# Patient Record
Sex: Male | Born: 1970 | Race: White | Hispanic: No | Marital: Single | Smoking: Never smoker
Health system: Southern US, Community
[De-identification: ages and names within clinical notes are randomized; demographics above are authoritative.]

## PROBLEM LIST (undated history)

## (undated) ENCOUNTER — Ambulatory Visit (HOSPITAL_COMMUNITY): Admission: EM | Payer: Medicaid Other | Source: Home / Self Care

## (undated) DIAGNOSIS — S82141A Displaced bicondylar fracture of right tibia, initial encounter for closed fracture: Secondary | ICD-10-CM

## (undated) DIAGNOSIS — F319 Bipolar disorder, unspecified: Secondary | ICD-10-CM

## (undated) DIAGNOSIS — M545 Low back pain: Principal | ICD-10-CM

## (undated) DIAGNOSIS — M549 Dorsalgia, unspecified: Secondary | ICD-10-CM

## (undated) DIAGNOSIS — R51 Headache: Secondary | ICD-10-CM

## (undated) DIAGNOSIS — G43909 Migraine, unspecified, not intractable, without status migrainosus: Secondary | ICD-10-CM

## (undated) DIAGNOSIS — M479 Spondylosis, unspecified: Secondary | ICD-10-CM

## (undated) DIAGNOSIS — F419 Anxiety disorder, unspecified: Secondary | ICD-10-CM

## (undated) DIAGNOSIS — I10 Essential (primary) hypertension: Secondary | ICD-10-CM

## (undated) DIAGNOSIS — F32A Depression, unspecified: Secondary | ICD-10-CM

## (undated) DIAGNOSIS — G4700 Insomnia, unspecified: Secondary | ICD-10-CM

## (undated) DIAGNOSIS — M79621 Pain in right upper arm: Secondary | ICD-10-CM

## (undated) DIAGNOSIS — J189 Pneumonia, unspecified organism: Secondary | ICD-10-CM

## (undated) DIAGNOSIS — E7800 Pure hypercholesterolemia, unspecified: Secondary | ICD-10-CM

## (undated) DIAGNOSIS — G8929 Other chronic pain: Principal | ICD-10-CM

## (undated) DIAGNOSIS — R519 Headache, unspecified: Secondary | ICD-10-CM

## (undated) DIAGNOSIS — R197 Diarrhea, unspecified: Secondary | ICD-10-CM

## (undated) DIAGNOSIS — R413 Other amnesia: Secondary | ICD-10-CM

## (undated) DIAGNOSIS — F4310 Post-traumatic stress disorder, unspecified: Secondary | ICD-10-CM

## (undated) DIAGNOSIS — F329 Major depressive disorder, single episode, unspecified: Secondary | ICD-10-CM

## (undated) HISTORY — DX: Spondylosis, unspecified: M47.9

## (undated) HISTORY — DX: Pain in right upper arm: M79.621

## (undated) HISTORY — DX: Headache, unspecified: R51.9

## (undated) HISTORY — DX: Other chronic pain: G89.29

## (undated) HISTORY — DX: Headache: R51

## (undated) HISTORY — DX: Migraine, unspecified, not intractable, without status migrainosus: G43.909

## (undated) HISTORY — DX: Low back pain: M54.5

## (undated) HISTORY — DX: Post-traumatic stress disorder, unspecified: F43.10

## (undated) HISTORY — DX: Dorsalgia, unspecified: M54.9

## (undated) HISTORY — PX: MULTIPLE TOOTH EXTRACTIONS: SHX2053

---

## 2013-06-13 ENCOUNTER — Encounter (HOSPITAL_COMMUNITY): Payer: Self-pay | Admitting: Emergency Medicine

## 2013-06-13 ENCOUNTER — Emergency Department (INDEPENDENT_AMBULATORY_CARE_PROVIDER_SITE_OTHER)
Admission: EM | Admit: 2013-06-13 | Discharge: 2013-06-13 | Disposition: A | Discharge status: Home / Self Care | Payer: Medicaid Other | Source: Home / Self Care | Attending: Family Medicine | Admitting: Family Medicine

## 2013-06-13 DIAGNOSIS — J111 Influenza due to unidentified influenza virus with other respiratory manifestations: Secondary | ICD-10-CM

## 2013-06-13 NOTE — ED Notes (Signed)
Reported cough, fever since yesterday

## 2013-06-13 NOTE — ED Provider Notes (Signed)
CSN: [id number]     Arrival date & time 06/13/13  4098 History   First MD Initiated Contact with Patient 06/13/13 1921     No chief complaint on file.  (Consider location/radiation/quality/duration/timing/severity/associated sxs/prior Treatment) Patient is a 42 y.o. male presenting with URI. The history is provided by the patient.  URI Presenting symptoms: congestion, cough, fever and rhinorrhea   Severity:  Mild Onset quality:  Sudden Duration:  2 days Progression:  Unchanged Chronicity:  New Associated symptoms: myalgias   Risk factors: sick contacts     No past medical history on file. No past surgical history on file. No family history on file. History  Substance Use Topics  . Smoking status: Not on file  . Smokeless tobacco: Not on file  . Alcohol Use: Not on file    Review of Systems  Constitutional: Positive for fever and chills.  HENT: Positive for congestion and rhinorrhea.   Respiratory: Positive for cough.   Gastrointestinal: Negative.   Genitourinary: Negative.   Musculoskeletal: Positive for myalgias.    Allergies  Review of patient's allergies indicates not on file.  Home Medications  No current outpatient prescriptions on file. BP 127/82  Pulse 108  Temp(Src) 100.3 F (37.9 C) (Oral)  Resp 16  SpO2 100% Physical Exam  Nursing note and vitals reviewed. Constitutional: He is oriented to person, place, and time. He appears well-developed and well-nourished. No distress.  HENT:  Head: Normocephalic.  Right Ear: External ear normal.  Left Ear: External ear normal.  Mouth/Throat: Oropharynx is clear and moist.  Eyes: Conjunctivae are normal. Pupils are equal, round, and reactive to light.  Neck: Normal range of motion. Neck supple.  Cardiovascular: Regular rhythm, normal heart sounds and intact distal pulses.   Pulmonary/Chest: Effort normal and breath sounds normal. He has no wheezes. He has no rales.  Abdominal: Soft. Bowel sounds are normal.   Lymphadenopathy:    He has no cervical adenopathy.  Neurological: He is alert and oriented to person, place, and time.  Skin: Skin is warm and dry.    ED Course  Procedures (including critical care time) Labs Review Labs Reviewed - No data to display Imaging Review No results found.  EKG Interpretation    Date/Time:    Ventricular Rate:    PR Interval:    QRS Duration:   QT Interval:    QTC Calculation:   R Axis:     Text Interpretation:              MDM   1. Influenza-like illness       Linna Hoff, MD 06/13/13 [phone]

## 2013-12-17 ENCOUNTER — Emergency Department (HOSPITAL_COMMUNITY)
Admission: EM | Admit: 2013-12-17 | Discharge: 2013-12-17 | Disposition: A | Discharge status: Home / Self Care | Payer: Medicaid Other | Attending: Emergency Medicine | Admitting: Emergency Medicine

## 2013-12-17 ENCOUNTER — Encounter (HOSPITAL_COMMUNITY): Payer: Self-pay | Admitting: Emergency Medicine

## 2013-12-17 DIAGNOSIS — Z87891 Personal history of nicotine dependence: Secondary | ICD-10-CM | POA: Insufficient documentation

## 2013-12-17 DIAGNOSIS — L237 Allergic contact dermatitis due to plants, except food: Secondary | ICD-10-CM

## 2013-12-17 DIAGNOSIS — L255 Unspecified contact dermatitis due to plants, except food: Secondary | ICD-10-CM | POA: Insufficient documentation

## 2013-12-17 MED ORDER — PREDNISONE 20 MG PO TABS: 60.0000 mg | ORAL_TABLET | Freq: Every day | ORAL | Status: DC

## 2013-12-17 MED ORDER — HYDROCORTISONE 2.5 % EX LOTN: TOPICAL_LOTION | Freq: Two times a day (BID) | CUTANEOUS | Status: DC

## 2013-12-17 NOTE — ED Notes (Signed)
Pt noticed red itchy rash to entire body yesterday, worse today. [place] warehouse full [place] iv[place]. Denies any other complaints

## 2013-12-17 NOTE — ED Notes (Signed)
Rash all over his body since yesterday.  It started on his rt hand and has spread over his body

## 2013-12-17 NOTE — ED Provider Notes (Signed)
Medical screening examination/treatment/procedure(s) were performed by non-physician practitioner and as supervising physician I was immediately available for consultation/collaboration.   EKG Interpretation None        Dannon Perlow, MD 12/17/13 2010 

## 2013-12-17 NOTE — ED Provider Notes (Signed)
CSN: [id number]     Arrival date & time 12/17/13  1710 History  This chart was scribed for non-physician practitioner working with Glynn OctaveStephen Rancour, MD by Elveria Risingimelie Horne, ED Scribe. This patient was seen in room TR11C/TR11C and the patient's care was started at 6:55 PM.   Chief Complaint  Patient presents with  . Rash     The history is provided by the patient. No language interpreter was used.   HPI Comments: Louis [name] is a 43 y.o. male who presents to the Emergency Department complaining of worsening red, pruritic rash, ongoing for several days. Patient reports performing recent yard work in an area with known poison ivy. Rash generalized over the entire body primarily on the bilateral legs and arms, abdomen, and back. Patient reports that the rash has doubled in size since yesterday. Treatment with calamine lotion and an organic ointment, without relief. Patient denies swelling of lips, tongue or throat. Patient denies introduction of new products or medications.  Denies fever or chills.   History reviewed. No pertinent past medical history. History reviewed. No pertinent past surgical history. History reviewed. No pertinent family history. History  Substance Use Topics  . Smoking status: Former Games developermoker  . Smokeless tobacco: Not on file  . Alcohol Use: No    Review of Systems  Constitutional: Negative for chills.  Respiratory: Negative for shortness of breath.   Skin: Positive for color change and rash.      Allergies  Review of patient's allergies indicates no known allergies.  Home Medications   Prior to Admission medications   Not on File   Triage Vitals: BP 126/92  Pulse 83  Temp(Src) 97.9 F (36.6 C) (Oral)  Resp 16  Ht 5\' 9"  (1.753 m)  Wt 195 lb (88.451 kg)  BMI 28.78 kg/m2  SpO2 96% Physical Exam  Nursing note and vitals reviewed. Constitutional: He is oriented to person, place, and time. He appears well-developed and well-nourished. No distress.  HENT:   Head: Normocephalic and atraumatic.  Mouth/Throat: Oropharynx is clear and moist.  Eyes: EOM are normal.  Neck: Neck supple.  Cardiovascular: Normal rate.   Pulmonary/Chest: Effort normal. No respiratory distress.  Musculoskeletal: Normal range of motion.  Neurological: He is alert and oriented to person, place, and time.  Skin: Skin is warm and dry. Rash noted. There is erythema.  Diffuse erythematous papular rash located around the waistline of abdomen and back and on bilateral legs, arms, forearms, sparing the upper arm that is covered by clothing. No facial rash. No rash present in web spaces of the fingers.  Psychiatric: He has a normal mood and affect. His behavior is normal.    ED Course  Procedures (including critical care time) COORDINATION OF CARE: 7:04 PM- Discussed treatment plan with patient at bedside and patient agreed to plan.   Labs Review Labs Reviewed - No data to display  Imaging Review No results found.   EKG Interpretation None      MDM   Final diagnoses:  None   Patient presenting with a rash after exposure [facility].  Appearance of rash consistent with Poison Ivy.  Patient afebrile.  Non toxic appearing.  No swelling of tongue, lips, or throat.  Feel that the patient is stable for discharge.  Patient given Rx for Hydrocortisone and Prednisone and instructed to take Benadryl for itching.  Patient stable for discharge.  Return precautions given.  I personally performed the services described in this documentation, which was scribed in  my presence. The recorded information has been reviewed and is accurate.    Santiago GladHeather Laisure, PA-C 12/17/13 1913

## 2013-12-17 NOTE — ED Notes (Signed)
Pt waiting to be seen.

## 2013-12-24 ENCOUNTER — Emergency Department (HOSPITAL_COMMUNITY)
Admission: EM | Admit: 2013-12-24 | Discharge: 2013-12-24 | Disposition: A | Discharge status: Home / Self Care | Payer: Medicaid Other | Attending: Emergency Medicine | Admitting: Emergency Medicine

## 2013-12-24 ENCOUNTER — Encounter (HOSPITAL_COMMUNITY): Payer: Self-pay | Admitting: Emergency Medicine

## 2013-12-24 DIAGNOSIS — L255 Unspecified contact dermatitis due to plants, except food: Secondary | ICD-10-CM | POA: Insufficient documentation

## 2013-12-24 DIAGNOSIS — G478 Other sleep disorders: Secondary | ICD-10-CM | POA: Insufficient documentation

## 2013-12-24 DIAGNOSIS — Z87891 Personal history of nicotine dependence: Secondary | ICD-10-CM | POA: Insufficient documentation

## 2013-12-24 DIAGNOSIS — L259 Unspecified contact dermatitis, unspecified cause: Secondary | ICD-10-CM

## 2013-12-24 MED ORDER — HYDROCORTISONE 2.5 % EX LOTN: TOPICAL_LOTION | Freq: Two times a day (BID) | CUTANEOUS | Status: DC

## 2013-12-24 MED ORDER — PREDNISONE 20 MG PO TABS: 60.0000 mg | ORAL_TABLET | Freq: Every day | ORAL | Status: DC

## 2013-12-24 NOTE — ED Notes (Signed)
Pt here for poison ivy check.  sts rash got better than worse again when medication ran out.

## 2013-12-24 NOTE — Discharge Instructions (Signed)
Please read and follow all provided instructions.  Your diagnoses today include:  1. Contact dermatitis     Tests performed today include:  Vital signs. See below for your results today.   Medications prescribed:   Prednisone - steroid medicine   It is best to take this medication in the morning to prevent sleeping problems. If you are diabetic, monitor your blood sugar closely and stop taking Prednisone if blood sugar is over 300. Take with food to prevent stomach upset.   Take any prescribed medications only as directed.  Home care instructions:  Follow any educational materials contained in this packet.  BE VERY CAREFUL not to take multiple medicines containing Tylenol (also called acetaminophen). Doing so can lead to an overdose which can damage your liver and cause liver failure and possibly death.   Follow-up instructions: Please follow-up with your primary care provider in the next 3 days for further evaluation of your symptoms.   Return instructions:   Please return to the Emergency Department if you experience worsening symptoms.   Please return if you have any other emergent concerns.  Additional Information:  Your vital signs today were: BP 140/86   Pulse 95   Temp(Src) 98.7 F (37.1 C) (Oral)   Resp 16   SpO2 98% If your blood pressure (BP) was elevated above 135/85 this visit, please have this repeated by your doctor within one month. --------------

## 2013-12-24 NOTE — ED Provider Notes (Signed)
CSN: [id number]     Arrival date & time 12/24/13  1958 History   This chart was scribed for non-physician practitioner working with [name], MD by Elveria Risingimelie Horne, ED Scribe. This patient was seen in room TR08C/TR08C and the patient's care was started at 8:05 PM.   Chief Complaint  Patient presents with  . Poison Ivy     The history is provided by the patient. No language interpreter was used.   HPI Comments: Louis [name] is a 43 y.o. male who presents to the Emergency Department poison ivy recheck. Patient reports improvement with the Prednisone given at his last visit (6/20), but after finishing the course his rash returned. Patient reports night sweats and difficulty sleeping for the last week. Patient reports taking his last daily dosage of Prednisone during the evenings, around 8 or 9 pm and suspects that could be contributing to his trouble sleeping.  Patient reports weight gain and decreased voids since beginning steroid course. Patient is unsure of tick bites, but states that he does spend a lot of time in the woods. Patient mentions new infestation of bed bugs which he is attempting to eradicate with pesticides. Denies skin exposures to these chemicals.    History reviewed. No pertinent past medical history. History reviewed. No pertinent past surgical history. History reviewed. No pertinent family history. History  Substance Use Topics  . Smoking status: Former Games developermoker  . Smokeless tobacco: Not on file  . Alcohol Use: No    Review of Systems  Constitutional: Negative for fever and chills.  HENT: Negative for facial swelling and trouble swallowing.   Eyes: Negative for redness.  Respiratory: Negative for shortness of breath, wheezing and stridor.   Cardiovascular: Negative for chest pain.  Gastrointestinal: Negative for nausea and vomiting.  Musculoskeletal: Negative for myalgias.  Skin: Positive for rash.  Neurological: Negative for light-headedness.   Psychiatric/Behavioral: Positive for sleep disturbance. Negative for confusion.   Allergies  Review of patient's allergies indicates no known allergies.  Home Medications   Prior to Admission medications   Medication Sig Start Date End Date Taking? Authorizing Provider  hydrocortisone 2.5 % lotion Apply topically 2 (two) times daily. 12/24/13   Renne CriglerJoshua Daphine Loch, PA-C  predniSONE (DELTASONE) 20 MG tablet Take 3 tablets (60 mg total) by mouth daily. Take 3 tablets PO once daily for 3 days, then 2 tablets PO once daily for 3 days, then 1 tablet PO daily for 3 days 12/24/13   Renne CriglerJoshua Miku Udall, PA-C   Triage Vitals: BP 140/86  Pulse 95  Temp(Src) 98.7 F (37.1 C) (Oral)  Resp 16  SpO2 98%  Physical Exam  Nursing note and vitals reviewed. Constitutional: He appears well-developed and well-nourished. No distress.  HENT:  Head: Normocephalic and atraumatic.  Mouth/Throat: Oropharynx is clear and moist.  No oropharyngeal lesions  Eyes: Conjunctivae and EOM are normal. Right eye exhibits no discharge. Left eye exhibits no discharge.  Neck: Normal range of motion. Neck supple. No tracheal deviation present.  Cardiovascular: Normal rate, regular rhythm and normal heart sounds.   Pulmonary/Chest: Effort normal and breath sounds normal. No respiratory distress.  Abdominal: Soft. There is no tenderness.  Musculoskeletal: Normal range of motion. He exhibits no edema and no tenderness.  Neurological: He is alert.  Skin: Skin is warm and dry. Rash noted.  Patient with diffuse erythematous rash over torso, upper and lower extremities consistent with contact dermatitis. There are excoriations on the lower extremities. No palmar rash.   Psychiatric: He  has a normal mood and affect. His behavior is normal.    ED Course  Procedures (including critical care time)  COORDINATION OF CARE: 8:14 PM- Discussed treatment plan with patient at bedside and patient agreed to plan.    Labs Review Labs Reviewed -  No data to display  Imaging Review No results found.   EKG Interpretation None      8:22 PM Patient seen and examined.    Vital signs reviewed and are as follows: Filed Vitals:   12/24/13 2006  BP: 140/86  Pulse: 95  Temp: 98.7 F (37.1 C)  Resp: 16   Rx oral prednisone taper, hydrocortisone.    MDM   Final diagnoses:  Contact dermatitis   Rash: consistent with rebound reaction from contact dermatitis. No confirmed tick bite and this does not appear to be tick borne illness. No new meds or foods. Does not appear to be SJS/TEN.   Sleep disturbance: 2/2 prednisone, patient to take prednisone in morning.   Night sweats: may be related to medication, would not eval further at this point unless symptoms persist.   I personally performed the services described in this documentation, which was scribed in my presence. The recorded information has been reviewed and is accurate.    Renne CriglerJoshua Codie Hainer, PA-C 12/24/13 2028

## 2013-12-27 NOTE — ED Provider Notes (Signed)
Medical screening examination/treatment/procedure(s) were performed by non-physician practitioner and as supervising physician I was immediately available for consultation/collaboration.   EKG Interpretation None        David H Yao, MD 12/27/13 0711 

## 2014-01-15 ENCOUNTER — Encounter (HOSPITAL_COMMUNITY): Payer: Self-pay | Admitting: Emergency Medicine

## 2014-01-15 ENCOUNTER — Emergency Department (HOSPITAL_COMMUNITY)
Admission: EM | Admit: 2014-01-15 | Discharge: 2014-01-15 | Disposition: A | Discharge status: Home / Self Care | Payer: Medicaid Other | Source: Home / Self Care | Attending: Emergency Medicine | Admitting: Emergency Medicine

## 2014-01-15 DIAGNOSIS — B356 Tinea cruris: Secondary | ICD-10-CM

## 2014-01-15 MED ORDER — TRIAMCINOLONE ACETONIDE 0.1 % EX CREA: TOPICAL_CREAM | CUTANEOUS | Status: DC

## 2014-01-15 MED ORDER — KETOCONAZOLE 2 % EX CREA: 1.0000 "application " | TOPICAL_CREAM | Freq: Two times a day (BID) | CUTANEOUS | Status: DC

## 2014-01-15 NOTE — ED Notes (Signed)
Pt. Stated, I have a rash in my groin area maybe its heat.  I had unprotected sex with a male about a couple of weeks ago so Not sure if that is a contributing factor

## 2014-01-15 NOTE — Discharge Instructions (Signed)
Jock Itch Jock itch is a fungal infection of the skin in the groin area. It is sometimes called "ringworm" even though it is not caused by a worm. A fungus is a type of germ that thrives in dark, damp places.  CAUSES  This infection may spread from:  A fungus infection elsewhere on the body (such as athlete's foot).  Sharing towels or clothing. This infection is more common in:  Hot, humid climates.  People who wear tight-fitting clothing or wet bathing suits for long periods of time.  Athletes.  Overweight people.  People with diabetes. SYMPTOMS  Jock itch causes the following symptoms:  Red, pink or brown rash in the groin. Rash may spread to the thighs, anus, and buttocks.  Itching. DIAGNOSIS  Your caregiver may make the diagnosis by looking at the rash. Sometimes a skin scraping will be sent to test for fungus. Testing can be done either by looking under the microscope or by doing a culture (test to try to grow the fungus). A culture can take up to 2 weeks to come back. TREATMENT  Jock itch may be treated with:  Skin cream or ointment to kill fungus.  Medicine by mouth to kill fungus.  Skin cream or ointment to calm the itching.  Compresses or medicated powders to dry the infected skin. HOME CARE INSTRUCTIONS   Be sure to treat the rash completely. Follow your caregiver's instructions. It can take a couple of weeks to treat. If you do not treat the infection long enough, the rash can come back.  Wear loose-fitting clothing.  Men should wear cotton boxer shorts.  Women should wear cotton underwear.  Avoid hot baths.  Dry the groin area well after bathing. SEEK MEDICAL CARE IF:   Your rash is worse.  Your rash is spreading.  Your rash returns after treatment is finished.  Your rash is not gone in 4 weeks. Fungal infections are slow to respond to treatment. Some redness may remain for several weeks after the fungus is gone. SEEK IMMEDIATE MEDICAL CARE  IF:  The area becomes red, warm, tender, and swollen.  You have a fever. Document Released: 06/06/2002 Document Revised: 09/08/2011 Document Reviewed: 05/05/2008 Exit[employer] Patient Information 2015 [employer]. This information is not intended to replace advice given to you by your health care provider. Make sure you discuss any questions you have with your health care provider.  

## 2014-01-15 NOTE — ED Provider Notes (Signed)
  Chief Complaint    Chief Complaint  Patient presents with  . Rash    History of Present Illness      Louis [name] is a 43 year old male who has had a several year long history of rash in both groin areas. This is very itchy. He's tried over-the-counter fungal creams without relief. He denies any rash elsewhere. Sometimes this feels somewhat painful. He's concerned that this might be an STD.  Review of Systems   Other than as noted above, the patient denies any of the following symptoms: Systemic:  No fever or chills. ENT:  No nasal congestion, rhinorrhea, sore throat, swelling of lips, tongue or throat. Resp:  No cough, wheezing, or shortness of breath.  PMFSH    Past medical history, family history, social history, meds, and allergies were reviewed.   Physical Exam     Vital signs:  BP 137/87  Pulse 77  Temp(Src) 99.6 F (37.6 C) (Oral)  Resp 16  SpO2 97% Gen:  Alert, oriented, in no distress. ENT:  Pharynx clear, no intraoral lesions, moist mucous membranes. Lungs:  Clear to auscultation. Skin:  There is an erythematous rash in both groin areas. This has multiple small satellite lesions and there is rash on the scrotum and the penis.  Assessment    The encounter diagnosis was Tinea cruris.  This has the appearance of a fungal or yeast infection, I favor a yeast infection, since he has multiple satellite lesions, and also rash on the scrotum and the penis. This is not an STD.  Plan     1.  Meds:  The following meds were prescribed:   Discharge Medication List as of 01/15/2014 11:37 AM    START taking these medications   Details  ketoconazole (NIZORAL) 2 % cream Apply 1 application topically 2 (two) times daily., Starting 01/15/2014, Until Discontinued, Normal    triamcinolone cream (KENALOG) 0.1 % Apply to poison ivy rash TID, Normal        2.  Patient Education/Counseling:  The patient was given appropriate handouts, self care instructions, and instructed in  symptomatic relief.    3.  Follow up:  The patient was told to follow up here if no better in 3 to 4 days, or sooner if becoming worse in any way, and given some red flag symptoms such as worsening rash, fever, or difficulty breathing which would prompt immediate return.  Follow up here if necessary.      Reuben Likesavid C Kenston Longton, MD 01/15/14 2111

## 2014-03-08 ENCOUNTER — Encounter: Payer: Self-pay | Admitting: Neurology

## 2014-03-09 ENCOUNTER — Ambulatory Visit (INDEPENDENT_AMBULATORY_CARE_PROVIDER_SITE_OTHER): Payer: Medicaid Other | Admitting: Neurology

## 2014-03-09 ENCOUNTER — Encounter: Payer: Self-pay | Admitting: Neurology

## 2014-03-09 ENCOUNTER — Encounter (INDEPENDENT_AMBULATORY_CARE_PROVIDER_SITE_OTHER): Payer: Self-pay

## 2014-03-09 VITALS — BP 124/77 | HR 79 | Resp 17 | Ht 69.5 in | Wt 195.0 lb

## 2014-03-09 DIAGNOSIS — M545 Low back pain, unspecified: Secondary | ICD-10-CM

## 2014-03-09 DIAGNOSIS — M47812 Spondylosis without myelopathy or radiculopathy, cervical region: Secondary | ICD-10-CM

## 2014-03-09 DIAGNOSIS — M79621 Pain in right upper arm: Secondary | ICD-10-CM

## 2014-03-09 DIAGNOSIS — G8929 Other chronic pain: Secondary | ICD-10-CM | POA: Insufficient documentation

## 2014-03-09 DIAGNOSIS — M79609 Pain in unspecified limb: Secondary | ICD-10-CM

## 2014-03-09 HISTORY — DX: Low back pain, unspecified: M54.50

## 2014-03-09 HISTORY — DX: Pain in right upper arm: M79.621

## 2014-03-09 MED ORDER — ALPRAZOLAM 1 MG PO TABS: ORAL_TABLET | ORAL | Status: DC

## 2014-03-09 NOTE — Addendum Note (Signed)
Addended by: Melvyn Novas on: 03/09/2014 01:47 PM   Modules accepted: Orders

## 2014-03-09 NOTE — Addendum Note (Signed)
Addended by: Melvyn Novas on: 03/09/2014 01:46 PM   Modules accepted: Orders

## 2014-03-09 NOTE — Patient Instructions (Signed)
Electromyography (EMG) Test This is a test in which very small electrodes are placed into your muscle tissue. It looks at the electrical impulses of your muscle tissue. This test is used to determine whether or not there are involuntary or spontaneous muscle movements. Involuntary or spontaneous means muscle movements that happen by themselves. This may indicate injury or disease of the nerves which supply that muscle. PREPARATION FOR TEST No preparation or fasting is necessary. Some stimulants such as caffeine and tobacco may need to be avoided for 2-3 hours before test or as instructed by your caregiver. NORMAL FINDINGS No evidence of neuromuscular abnormalities. Ranges for normal findings may vary among different laboratories and hospitals. You should always check with your doctor after having lab work or other tests done to discuss the meaning of your test results and whether your values are considered within normal limits. MEANING OF TEST  Your caregiver will go over the test results with you and discuss the importance and meaning of your results, as well as treatment options and the need for additional tests if necessary. OBTAINING THE TEST RESULTS It is your responsibility to obtain your test results. Ask the lab or department performing the test when and how you will get your results. Document Released: 10/17/2004 Document Revised: 09/08/2011 Document Reviewed: 05/26/2008 Exit[employer] Patient Information 2015 [employer]. This information is not intended to replace advice given to you by your health care provider. Make sure you discuss any questions you have with your health care provider.  
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
## 2014-03-13 ENCOUNTER — Ambulatory Visit: Payer: Medicaid Other | Admitting: Neurology

## 2014-03-15 ENCOUNTER — Encounter: Payer: Medicaid Other | Admitting: Radiology

## 2014-03-17 ENCOUNTER — Ambulatory Visit (INDEPENDENT_AMBULATORY_CARE_PROVIDER_SITE_OTHER): Payer: Medicaid Other | Admitting: Neurology

## 2014-03-17 ENCOUNTER — Encounter (INDEPENDENT_AMBULATORY_CARE_PROVIDER_SITE_OTHER): Payer: Medicaid Other

## 2014-03-17 DIAGNOSIS — M545 Low back pain, unspecified: Secondary | ICD-10-CM

## 2014-03-17 DIAGNOSIS — M79631 Pain in right forearm: Secondary | ICD-10-CM

## 2014-03-17 DIAGNOSIS — Z0289 Encounter for other administrative examinations: Secondary | ICD-10-CM

## 2014-03-17 DIAGNOSIS — G8929 Other chronic pain: Secondary | ICD-10-CM

## 2014-03-17 DIAGNOSIS — M79609 Pain in unspecified limb: Secondary | ICD-10-CM

## 2014-03-17 NOTE — Procedures (Signed)
     HISTORY:  Louis [name] is a 43 year old patient with a several month history of right forearm discomfort. The patient does have some neck discomfort off and on, but no pain radiating down the right arm. The patient is being evaluated for the ongoing arm pain.  NERVE CONDUCTION STUDIES:  Nerve conduction studies were performed on both upper extremities. The distal motor latencies and motor amplitudes for the median and ulnar nerves were within normal limits. The F wave latencies and nerve conduction velocities for these nerves were also normal. The sensory latencies for the median and ulnar nerves were normal.   EMG STUDIES:  EMG study was performed on the right upper extremity:  The first dorsal interosseous muscle reveals 2 to 4 K units with full recruitment. No fibrillations or positive waves were noted. The abductor pollicis brevis muscle reveals 2 to 4 K units with full recruitment. No fibrillations or positive waves were noted. The extensor indicis proprius muscle reveals 1 to 3 K units with full recruitment. No fibrillations or positive waves were noted. The pronator teres muscle reveals 2 to 3 K units with full recruitment. No fibrillations or positive waves were noted. The biceps muscle reveals 1 to 2 K units with full recruitment. No fibrillations or positive waves were noted. The triceps muscle reveals 2 to 4 K units with full recruitment. No fibrillations or positive waves were noted. The anterior deltoid muscle reveals 2 to 3 K units with full recruitment. No fibrillations or positive waves were noted. The cervical paraspinal muscles were tested at 2 levels. No abnormalities of insertional activity were seen at either level tested. There was poor relaxation.   IMPRESSION:  Nerve conduction studies done on both upper extremities were within normal limits. No evidence of a neuropathy is seen. EMG evaluation of the right upper extremity was normal, without evidence of an  overlying cervical radiculopathy.  Marlan Palau MD 03/17/2014 1:50 PM  [facility] [address] [place], [place] [address]  Phone [phone] Fax [phone]

## 2014-03-20 ENCOUNTER — Telehealth: Payer: Self-pay | Admitting: Neurology

## 2014-03-20 DIAGNOSIS — M79606 Pain in leg, unspecified: Secondary | ICD-10-CM

## 2014-03-20 DIAGNOSIS — G4733 Obstructive sleep apnea (adult) (pediatric): Secondary | ICD-10-CM

## 2014-03-20 NOTE — Telephone Encounter (Signed)
Patient returning a missed call to [place] but does not have access to his VM, and there was no documentation. Please call and advise.

## 2014-03-20 NOTE — Telephone Encounter (Signed)
Patient was given his results of his emg.  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  Patient also states he has lower back x-rays and wanted to know if Dr. Vickey Huger needed to review those reports.

## 2014-03-21 NOTE — Telephone Encounter (Signed)
i will defer any limits in lifting , bending and ambulatory capacity to PT -  This is not in my scope of practice.

## 2014-03-22 NOTE — Telephone Encounter (Signed)
Patient was notified and advised to discuss with his primary care physician.

## 2014-04-30 HISTORY — PX: ORIF TIBIA FRACTURE: SHX5416

## 2014-05-04 ENCOUNTER — Encounter (HOSPITAL_COMMUNITY): Payer: Self-pay | Admitting: Emergency Medicine

## 2014-05-04 ENCOUNTER — Emergency Department (HOSPITAL_COMMUNITY): Payer: No Typology Code available for payment source

## 2014-05-04 ENCOUNTER — Emergency Department (HOSPITAL_COMMUNITY)
Admission: EM | Admit: 2014-05-04 | Discharge: 2014-05-04 | Disposition: A | Discharge status: Home / Self Care | Payer: No Typology Code available for payment source | Attending: Emergency Medicine | Admitting: Emergency Medicine

## 2014-05-04 DIAGNOSIS — Y9389 Activity, other specified: Secondary | ICD-10-CM | POA: Diagnosis not present

## 2014-05-04 DIAGNOSIS — G8929 Other chronic pain: Secondary | ICD-10-CM | POA: Diagnosis not present

## 2014-05-04 DIAGNOSIS — F4310 Post-traumatic stress disorder, unspecified: Secondary | ICD-10-CM | POA: Insufficient documentation

## 2014-05-04 DIAGNOSIS — Z7952 Long term (current) use of systemic steroids: Secondary | ICD-10-CM | POA: Insufficient documentation

## 2014-05-04 DIAGNOSIS — Z79899 Other long term (current) drug therapy: Secondary | ICD-10-CM | POA: Insufficient documentation

## 2014-05-04 DIAGNOSIS — S82201A Unspecified fracture of shaft of right tibia, initial encounter for closed fracture: Secondary | ICD-10-CM

## 2014-05-04 DIAGNOSIS — S81811A Laceration without foreign body, right lower leg, initial encounter: Secondary | ICD-10-CM | POA: Diagnosis not present

## 2014-05-04 DIAGNOSIS — S99911A Unspecified injury of right ankle, initial encounter: Secondary | ICD-10-CM | POA: Diagnosis not present

## 2014-05-04 DIAGNOSIS — M479 Spondylosis, unspecified: Secondary | ICD-10-CM | POA: Insufficient documentation

## 2014-05-04 DIAGNOSIS — M79601 Pain in right arm: Secondary | ICD-10-CM | POA: Diagnosis not present

## 2014-05-04 DIAGNOSIS — G43909 Migraine, unspecified, not intractable, without status migrainosus: Secondary | ICD-10-CM | POA: Diagnosis not present

## 2014-05-04 DIAGNOSIS — S8991XA Unspecified injury of right lower leg, initial encounter: Secondary | ICD-10-CM | POA: Diagnosis present

## 2014-05-04 DIAGNOSIS — S82301A Unspecified fracture of lower end of right tibia, initial encounter for closed fracture: Secondary | ICD-10-CM | POA: Diagnosis not present

## 2014-05-04 DIAGNOSIS — Y92410 Unspecified street and highway as the place of occurrence of the external cause: Secondary | ICD-10-CM | POA: Diagnosis not present

## 2014-05-04 LAB — BASIC METABOLIC PANEL
ANION GAP: 17 — AB (ref 5–15)
BUN: 12 mg/dL (ref 6–23)
CHLORIDE: 106 meq/L (ref 96–112)
CO2: 21 meq/L (ref 19–32)
Calcium: 9.2 mg/dL (ref 8.4–10.5)
Creatinine, Ser: 1.05 mg/dL (ref 0.50–1.35)
GFR calc Af Amer: >90 mL/min (ref >90)
GFR calc non Af Amer: 85 mL/min — ABNORMAL LOW (ref >90)
Glucose, Bld: 90 mg/dL (ref 70–99)
POTASSIUM: 3.6 meq/L — AB (ref 3.7–5.3)
SODIUM: 144 meq/L (ref 137–147)

## 2014-05-04 LAB — CBC WITH DIFFERENTIAL/PLATELET
BASOS ABS: 0 10*3/uL (ref 0.0–0.1)
Basophils Relative: 0 % (ref 0–1)
Eosinophils Absolute: 0.1 10*3/uL (ref 0.0–0.7)
Eosinophils Relative: 1 % (ref 0–5)
HCT: 39.7 % (ref 39.0–52.0)
HEMOGLOBIN: 12.9 g/dL — AB (ref 13.0–17.0)
LYMPHS PCT: 9 % — AB (ref 12–46)
Lymphs Abs: 1.3 10*3/uL (ref 0.7–4.0)
MCH: 27.3 pg (ref 26.0–34.0)
MCHC: 32.5 g/dL (ref 30.0–36.0)
MCV: 84.1 fL (ref 78.0–100.0)
Monocytes Absolute: 1.5 10*3/uL — ABNORMAL HIGH (ref 0.1–1.0)
Monocytes Relative: 10 % (ref 3–12)
NEUTROS PCT: 80 % — AB (ref 43–77)
Neutro Abs: 12 10*3/uL — ABNORMAL HIGH (ref 1.7–7.7)
PLATELETS: 216 10*3/uL (ref 150–400)
RBC: 4.72 MIL/uL (ref 4.22–5.81)
RDW: 13.5 % (ref 11.5–15.5)
WBC: 14.9 10*3/uL — ABNORMAL HIGH (ref 4.0–10.5)

## 2014-05-04 MED ORDER — SODIUM CHLORIDE 0.9 % IV BOLUS (SEPSIS)
1000.0000 mL | Freq: Once | INTRAVENOUS | Status: AC
  Administered 2014-05-04: 1000 mL via INTRAVENOUS

## 2014-05-04 MED ORDER — HYDROMORPHONE HCL 1 MG/ML IJ SOLN
1.0000 mg | Freq: Once | INTRAMUSCULAR | Status: AC
  Administered 2014-05-04: 1 mg via INTRAVENOUS
  Filled 2014-05-04: qty 1

## 2014-05-04 MED ORDER — DOCUSATE SODIUM 100 MG PO CAPS: 100.0000 mg | ORAL_CAPSULE | Freq: Two times a day (BID) | ORAL | Status: DC

## 2014-05-04 MED ORDER — LORAZEPAM 2 MG/ML IJ SOLN
1.0000 mg | Freq: Once | INTRAMUSCULAR | Status: AC
  Administered 2014-05-04: 1 mg via INTRAVENOUS
  Filled 2014-05-04: qty 1

## 2014-05-04 MED ORDER — ONDANSETRON HCL 4 MG/2ML IJ SOLN
4.0000 mg | Freq: Once | INTRAMUSCULAR | Status: AC
  Administered 2014-05-04: 4 mg via INTRAVENOUS
  Filled 2014-05-04: qty 2

## 2014-05-04 MED ORDER — MORPHINE SULFATE 4 MG/ML IJ SOLN
4.0000 mg | Freq: Once | INTRAMUSCULAR | Status: AC
  Administered 2014-05-04: 4 mg via INTRAVENOUS
  Filled 2014-05-04: qty 1

## 2014-05-04 MED ORDER — OXYCODONE-ACETAMINOPHEN 5-325 MG PO TABS: 1.0000 | ORAL_TABLET | ORAL | Status: DC | PRN

## 2014-05-04 MED ORDER — TETANUS-DIPHTH-ACELL PERTUSSIS 5-2.5-18.5 LF-MCG/0.5 IM SUSP: 0.5000 mL | Freq: Once | INTRAMUSCULAR | Status: DC

## 2014-05-04 NOTE — ED Notes (Signed)
Pt still in XRAY.  

## 2014-05-04 NOTE — ED Notes (Signed)
This RN received a phone call from radiology, having a difficult time carrying out XRAYs, pt is in a lot of pain.

## 2014-05-04 NOTE — ED Notes (Signed)
Pt gone to xray at this time

## 2014-05-04 NOTE — Progress Notes (Signed)
Orthopedic Tech Progress Note Patient Details:  Beatriz StallionKirby L Kham [birth year] [id number]  Ortho Devices Type of Ortho Device: Crutches, Lenora BoysWatson Jones splint, Knee Immobilizer Ortho Device/Splint Location: RLE Ortho Device/Splint Interventions: Ordered, Application   Jennye MoccasinHughes, Tramayne Sebesta Craig 05/04/2014, 9:36 PM

## 2014-05-04 NOTE — ED Notes (Signed)
Spoke with ortho regarding pt's new orders.
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
## 2014-05-04 NOTE — ED Notes (Signed)
Ortho at [facility]

## 2014-05-04 NOTE — ED Notes (Signed)
MD informed pt is in a lot of pain.

## 2014-05-05 ENCOUNTER — Emergency Department (HOSPITAL_COMMUNITY): Payer: No Typology Code available for payment source

## 2014-05-05 ENCOUNTER — Encounter (HOSPITAL_COMMUNITY): Payer: Self-pay | Admitting: *Deleted

## 2014-05-05 ENCOUNTER — Emergency Department (HOSPITAL_COMMUNITY)
Admission: EM | Admit: 2014-05-05 | Discharge: 2014-05-05 | Disposition: A | Discharge status: Home / Self Care | Payer: No Typology Code available for payment source | Attending: Emergency Medicine | Admitting: Emergency Medicine

## 2014-05-05 DIAGNOSIS — G43909 Migraine, unspecified, not intractable, without status migrainosus: Secondary | ICD-10-CM | POA: Diagnosis not present

## 2014-05-05 DIAGNOSIS — Y9389 Activity, other specified: Secondary | ICD-10-CM | POA: Insufficient documentation

## 2014-05-05 DIAGNOSIS — F329 Major depressive disorder, single episode, unspecified: Secondary | ICD-10-CM | POA: Insufficient documentation

## 2014-05-05 DIAGNOSIS — R519 Headache, unspecified: Secondary | ICD-10-CM

## 2014-05-05 DIAGNOSIS — S82301A Unspecified fracture of lower end of right tibia, initial encounter for closed fracture: Secondary | ICD-10-CM | POA: Insufficient documentation

## 2014-05-05 DIAGNOSIS — Y92410 Unspecified street and highway as the place of occurrence of the external cause: Secondary | ICD-10-CM | POA: Insufficient documentation

## 2014-05-05 DIAGNOSIS — G8929 Other chronic pain: Secondary | ICD-10-CM | POA: Insufficient documentation

## 2014-05-05 DIAGNOSIS — S8991XA Unspecified injury of right lower leg, initial encounter: Secondary | ICD-10-CM | POA: Diagnosis present

## 2014-05-05 DIAGNOSIS — S82201A Unspecified fracture of shaft of right tibia, initial encounter for closed fracture: Secondary | ICD-10-CM

## 2014-05-05 DIAGNOSIS — R52 Pain, unspecified: Secondary | ICD-10-CM

## 2014-05-05 DIAGNOSIS — R51 Headache: Secondary | ICD-10-CM

## 2014-05-05 DIAGNOSIS — Z79899 Other long term (current) drug therapy: Secondary | ICD-10-CM | POA: Diagnosis not present

## 2014-05-05 DIAGNOSIS — S0990XA Unspecified injury of head, initial encounter: Secondary | ICD-10-CM | POA: Insufficient documentation

## 2014-05-05 DIAGNOSIS — Z7952 Long term (current) use of systemic steroids: Secondary | ICD-10-CM | POA: Insufficient documentation

## 2014-05-05 HISTORY — DX: Depression, unspecified: F32.A

## 2014-05-05 HISTORY — DX: Major depressive disorder, single episode, unspecified: F32.9

## 2014-05-05 MED ORDER — ONDANSETRON 4 MG PO TBDP
8.0000 mg | ORAL_TABLET | Freq: Once | ORAL | Status: AC
  Administered 2014-05-05: 8 mg via ORAL
  Filled 2014-05-05: qty 2

## 2014-05-05 MED ORDER — DIAZEPAM 5 MG PO TABS: 5.0000 mg | ORAL_TABLET | Freq: Three times a day (TID) | ORAL | Status: DC | PRN

## 2014-05-05 MED ORDER — HYDROMORPHONE HCL 1 MG/ML IJ SOLN
1.0000 mg | Freq: Once | INTRAMUSCULAR | Status: AC
  Administered 2014-05-05: 1 mg via INTRAVENOUS
  Filled 2014-05-05: qty 1

## 2014-05-05 MED ORDER — OXYCODONE-ACETAMINOPHEN 5-325 MG PO TABS: 1.0000 | ORAL_TABLET | ORAL | Status: DC | PRN

## 2014-05-05 MED ORDER — HYDROMORPHONE HCL 1 MG/ML IJ SOLN
2.0000 mg | Freq: Once | INTRAMUSCULAR | Status: AC
  Administered 2014-05-05: 2 mg via INTRAVENOUS
  Filled 2014-05-05: qty 2

## 2014-05-05 MED ORDER — PROMETHAZINE HCL 25 MG PO TABS: 25.0000 mg | ORAL_TABLET | Freq: Four times a day (QID) | ORAL | Status: DC | PRN

## 2014-05-05 NOTE — ED Notes (Signed)
Pt. Vomited again. Told patient plan of care and that we will watch him for a while but states "I'll be okay. I have kittens to get home to".

## 2014-05-05 NOTE — ED Notes (Addendum)
Pt. Advised not to drive or drink due to pain medicine. Patient stated "I need a note for court on Monday so that I don't drive while I take my pain medicine". PA aware and note printed and faxed. Pt. Asking "can I just have one pina colada?". Pt. Again educated on narcotic pain medicine.

## 2014-05-05 NOTE — ED Notes (Signed)
Pt. Began vomiting when he was in the wheelchair being rolled out to the waiting room. Brought back to room. PA informed
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
## 2014-05-05 NOTE — ED Notes (Signed)
Pt. Reports SI. Reports hx of depression and suicide attempts. When asked if he feels like harming himself currently, patient states "this is normal for me. I see someone every Monday for it. I have a great therapist".

## 2014-05-07 ENCOUNTER — Encounter (HOSPITAL_COMMUNITY): Payer: Self-pay | Admitting: Emergency Medicine

## 2014-05-07 ENCOUNTER — Emergency Department (HOSPITAL_COMMUNITY)
Admission: EM | Admit: 2014-05-07 | Discharge: 2014-05-08 | Disposition: A | Discharge status: Home / Self Care | Payer: No Typology Code available for payment source | Attending: Emergency Medicine | Admitting: Emergency Medicine

## 2014-05-07 DIAGNOSIS — Z79899 Other long term (current) drug therapy: Secondary | ICD-10-CM | POA: Diagnosis not present

## 2014-05-07 DIAGNOSIS — Z7952 Long term (current) use of systemic steroids: Secondary | ICD-10-CM | POA: Insufficient documentation

## 2014-05-07 DIAGNOSIS — M79604 Pain in right leg: Secondary | ICD-10-CM | POA: Diagnosis present

## 2014-05-07 DIAGNOSIS — R202 Paresthesia of skin: Secondary | ICD-10-CM | POA: Diagnosis not present

## 2014-05-07 DIAGNOSIS — G8929 Other chronic pain: Secondary | ICD-10-CM | POA: Insufficient documentation

## 2014-05-07 DIAGNOSIS — G43909 Migraine, unspecified, not intractable, without status migrainosus: Secondary | ICD-10-CM | POA: Diagnosis not present

## 2014-05-07 DIAGNOSIS — F329 Major depressive disorder, single episode, unspecified: Secondary | ICD-10-CM | POA: Diagnosis not present

## 2014-05-07 DIAGNOSIS — R209 Unspecified disturbances of skin sensation: Secondary | ICD-10-CM

## 2014-05-07 MED ORDER — HYDROMORPHONE HCL 1 MG/ML IJ SOLN
1.0000 mg | Freq: Once | INTRAMUSCULAR | Status: AC
  Administered 2014-05-07: 1 mg via INTRAMUSCULAR
  Filled 2014-05-07: qty 1

## 2014-05-07 NOTE — Discharge Instructions (Signed)
Keep right leg elevated. Do not put weight on the right leg
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
## 2014-05-08 ENCOUNTER — Other Ambulatory Visit: Payer: Self-pay | Admitting: Orthopedic Surgery

## 2014-05-08 ENCOUNTER — Encounter (HOSPITAL_COMMUNITY): Payer: Self-pay | Admitting: Radiology

## 2014-05-08 ENCOUNTER — Emergency Department (HOSPITAL_COMMUNITY): Payer: No Typology Code available for payment source

## 2014-05-08 LAB — I-STAT CHEM 8, ED
BUN: 14 mg/dL (ref 6–23)
CHLORIDE: 103 meq/L (ref 96–112)
CREATININE: 0.9 mg/dL (ref 0.50–1.35)
Calcium, Ion: 1.18 mmol/L (ref 1.12–1.23)
GLUCOSE: 99 mg/dL (ref 70–99)
HCT: 35 % — ABNORMAL LOW (ref 39.0–52.0)
Hemoglobin: 11.9 g/dL — ABNORMAL LOW (ref 13.0–17.0)
POTASSIUM: 4.3 meq/L (ref 3.7–5.3)
Sodium: 140 mEq/L (ref 137–147)
TCO2: 26 mmol/L (ref 0–100)

## 2014-05-08 MED ORDER — DIPHENHYDRAMINE HCL 50 MG/ML IJ SOLN
25.0000 mg | Freq: Once | INTRAMUSCULAR | Status: AC
  Administered 2014-05-08: 25 mg via INTRAVENOUS
  Filled 2014-05-08: qty 1

## 2014-05-08 MED ORDER — METHYLPREDNISOLONE SODIUM SUCC 125 MG IJ SOLR
125.0000 mg | Freq: Once | INTRAMUSCULAR | Status: AC
  Administered 2014-05-08: 125 mg via INTRAVENOUS
  Filled 2014-05-08: qty 2

## 2014-05-08 MED ORDER — IOHEXOL 350 MG/ML SOLN
100.0000 mL | Freq: Once | INTRAVENOUS | Status: AC | PRN
  Administered 2014-05-08: 100 mL via INTRAVENOUS
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
## 2014-05-08 NOTE — ED Notes (Signed)
Patient is alert and orientedx4.  Patient was explained discharge instructions and they understood them with no questions.   

## 2014-05-08 NOTE — ED Notes (Signed)
Patient transported to CT 
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
## 2014-05-08 NOTE — ED Provider Notes (Signed)
Patient signed out to me as pending CTA for popliteal injury. Patient's right lower extremity is more cold than the left. He does have a pulse present. Case was discussed by Dr. Manus Gunningancour with orthopedic surgery and vascular surgery and they believe this may be due to the swelling was having decreased flow of the artery. Compartments remain soft. Patient is in no acute distress laying comfortably in the bed. CT scan has a preliminary read as no vascular injury. Official reading will be available in the morning. Upon my repeat assessment, the patient's symptoms have not been any worse. I believe he is safe for discharge at this time and he can follow-up with his surgeons. Discharge paperwork was prepared by Dr. Manus Gunningancour and resident. Vital signs remain within his normal limits.  Tomasita CrumbleAdeleke Jiles Goya, MD 05/08/14 [phone]

## 2014-05-09 ENCOUNTER — Encounter (HOSPITAL_COMMUNITY)
Admission: RE | Admit: 2014-05-09 | Discharge: 2014-05-09 | Disposition: A | Discharge status: Home / Self Care | Payer: No Typology Code available for payment source | Source: Ambulatory Visit | Attending: Orthopedic Surgery | Admitting: Orthopedic Surgery

## 2014-05-09 ENCOUNTER — Encounter (HOSPITAL_COMMUNITY): Payer: Self-pay

## 2014-05-09 ENCOUNTER — Ambulatory Visit (HOSPITAL_COMMUNITY)
Admission: RE | Admit: 2014-05-09 | Discharge: 2014-05-09 | Disposition: A | Discharge status: Home / Self Care | Payer: No Typology Code available for payment source | Source: Ambulatory Visit | Attending: Orthopedic Surgery | Admitting: Orthopedic Surgery

## 2014-05-09 DIAGNOSIS — Z01818 Encounter for other preprocedural examination: Secondary | ICD-10-CM

## 2014-05-09 HISTORY — DX: Other amnesia: R41.3

## 2014-05-09 HISTORY — DX: Anxiety disorder, unspecified: F41.9

## 2014-05-09 HISTORY — DX: Pneumonia, unspecified organism: J18.9

## 2014-05-09 HISTORY — DX: Diarrhea, unspecified: R19.7

## 2014-05-09 HISTORY — DX: Bipolar disorder, unspecified: F31.9

## 2014-05-09 HISTORY — DX: Essential (primary) hypertension: I10

## 2014-05-09 HISTORY — DX: Displaced bicondylar fracture of right tibia, initial encounter for closed fracture: S82.141A

## 2014-05-09 HISTORY — DX: Insomnia, unspecified: G47.00

## 2014-05-09 HISTORY — DX: Pure hypercholesterolemia, unspecified: E78.00

## 2014-05-09 MED ORDER — CEFAZOLIN SODIUM-DEXTROSE 2-3 GM-% IV SOLR
2.0000 g | INTRAVENOUS | Status: AC
  Administered 2014-05-10: 2 g via INTRAVENOUS
  Filled 2014-05-09: qty 50

## 2014-05-09 NOTE — Progress Notes (Signed)
Pt forgot name of new PCP; pt stated that he is seen at [facility] for care.

## 2014-05-09 NOTE — Pre-Procedure Instructions (Signed)
Louis StallionKirby L Flener  05/09/2014   Your procedure is scheduled on:  Wednesday, May 10, 2014  [facility]:30 [facility].  Call this number if you have problems the morning of surgery: [phone]   Remember:   Do not eat food or drink liquids after midnight.   Take these medicines the morning of surgery with A SIP OF WATER: gabapentin (NEURONTIN),lamoTRIgine (LAMICTAL),Lurasidone HCl (LATUDA), Oxcarbazepine (TRILEPTAL), if needed: oxyCODONE ( PERCOCET/ROXICET) for pain,  SUMAtriptan (IMITREX) for migraine  Stop taking Aspirin, vitamins, and herbal medications (Melatonin).  Do not take any NSAIDs ie: Ibuprofen, Advil, Naproxen or any medication containing Aspirin.    Do not wear jewelry, make-up or nail polish.  Do not wear lotions, powders, or perfumes. You may not  wear deodorant.  Do not shave 48 hours prior to surgery. Men may shave face and neck.  Do not bring valuables to the hospital.  [facility] is not responsible for any belongings or valuables.               Contacts, dentures or bridgework may not be worn into surgery.  Leave suitcase in the car. After surgery it may be brought to your room.  For patients admitted to the hospital, discharge time is determined by your treatment team.               Patients discharged the day of surgery will not be allowed to drive home.  Name and phone number of your driver:   Special Instructions:  Special Instructions:Special Instructions: [facility] - Preparing for [facility]  Before surgery, you can play an important role.  Because skin is not sterile, your skin needs to be as free of germs as possible.  You can reduce the number of germs on you skin by washing with CHG (chlorahexidine gluconate) soap before surgery.  CHG is an antiseptic cleaner which kills germs and bonds with the skin to continue killing germs even after washing.  Please DO NOT use if you have an allergy to CHG or antibacterial soaps.  If your  skin becomes reddened/irritated stop using the CHG and inform your nurse when you arrive at Short Stay.  Do not shave (including legs and underarms) for at least 48 hours prior to the first CHG shower.  You may shave your face.  Please follow these instructions carefully:   1.  Shower with CHG Soap the night before surgery and the morning of Surgery.  2.  If you choose to wash your hair, wash your hair first as usual with your normal shampoo.  3.  After you shampoo, rinse your hair and body thoroughly to remove the Shampoo.  4.  Use CHG as you would any other liquid soap.  You can apply chg directly  to the skin and wash gently with scrungie or a clean washcloth.  5.  Apply the CHG Soap to your body ONLY FROM THE NECK DOWN.  Do not use on open wounds or open sores.  Avoid contact with your eyes, ears, mouth and genitals (private parts).  Wash genitals (private parts) with your normal soap.  6.  Wash thoroughly, paying special attention to the area where your surgery will be performed.  7.  Thoroughly rinse your body with warm water from the neck down.  8.  DO NOT shower/wash with your normal soap after using and rinsing off the CHG Soap.  9.  Pat yourself dry with a clean towel.  10.  Wear clean pajamas.            11.  Place clean sheets on your bed the night of your first shower and do not sleep with pets.  Day of Surgery  Do not apply any lotions/deodorants the morning of surgery.  Please wear clean clothes to the hospital/surgery center.   Please read over the following fact sheets that you were given: Pain Booklet, Coughing and Deep Breathing and Surgical Site Infection Prevention

## 2014-05-09 NOTE — Progress Notes (Signed)
Pt denies SOB, chest pain, and being under the care of a cardiologist. Pt denies having a chest x ray and EKG within the last year. Pt denies having a stress test, echo, and cardiac cath. 

## 2014-05-10 ENCOUNTER — Ambulatory Visit (HOSPITAL_COMMUNITY): Payer: No Typology Code available for payment source

## 2014-05-10 ENCOUNTER — Encounter (HOSPITAL_COMMUNITY): Payer: Self-pay | Admitting: Anesthesiology

## 2014-05-10 ENCOUNTER — Encounter (HOSPITAL_COMMUNITY)
Admission: RE | Disposition: A | Discharge status: Skilled Nursing Facility | Payer: Self-pay | Source: Ambulatory Visit | Attending: Orthopedic Surgery

## 2014-05-10 ENCOUNTER — Ambulatory Visit (HOSPITAL_COMMUNITY): Payer: No Typology Code available for payment source | Admitting: Certified Registered Nurse Anesthetist

## 2014-05-10 ENCOUNTER — Inpatient Hospital Stay (HOSPITAL_COMMUNITY)
Admission: RE | Admit: 2014-05-10 | Discharge: 2014-05-13 | DRG: 494 | Disposition: A | Discharge status: Skilled Nursing Facility | Payer: No Typology Code available for payment source | Source: Ambulatory Visit | Attending: Orthopedic Surgery | Admitting: Orthopedic Surgery

## 2014-05-10 DIAGNOSIS — F419 Anxiety disorder, unspecified: Secondary | ICD-10-CM | POA: Diagnosis present

## 2014-05-10 DIAGNOSIS — G43909 Migraine, unspecified, not intractable, without status migrainosus: Secondary | ICD-10-CM | POA: Diagnosis present

## 2014-05-10 DIAGNOSIS — M25561 Pain in right knee: Secondary | ICD-10-CM | POA: Diagnosis present

## 2014-05-10 DIAGNOSIS — I10 Essential (primary) hypertension: Secondary | ICD-10-CM | POA: Diagnosis present

## 2014-05-10 DIAGNOSIS — G4700 Insomnia, unspecified: Secondary | ICD-10-CM | POA: Diagnosis present

## 2014-05-10 DIAGNOSIS — S82141A Displaced bicondylar fracture of right tibia, initial encounter for closed fracture: Secondary | ICD-10-CM | POA: Diagnosis present

## 2014-05-10 DIAGNOSIS — F4310 Post-traumatic stress disorder, unspecified: Secondary | ICD-10-CM | POA: Diagnosis present

## 2014-05-10 DIAGNOSIS — S82141G Displaced bicondylar fracture of right tibia, subsequent encounter for closed fracture with delayed healing: Principal | ICD-10-CM

## 2014-05-10 DIAGNOSIS — F319 Bipolar disorder, unspecified: Secondary | ICD-10-CM | POA: Diagnosis present

## 2014-05-10 DIAGNOSIS — S82209A Unspecified fracture of shaft of unspecified tibia, initial encounter for closed fracture: Secondary | ICD-10-CM

## 2014-05-10 DIAGNOSIS — M545 Low back pain: Secondary | ICD-10-CM | POA: Diagnosis present

## 2014-05-10 HISTORY — PX: ORIF TIBIA PLATEAU: SHX2132

## 2014-05-10 SURGERY — OPEN REDUCTION INTERNAL FIXATION (ORIF) TIBIAL PLATEAU
Anesthesia: General | Laterality: Right

## 2014-05-10 MED ORDER — KCL IN DEXTROSE-NACL 20-5-0.45 MEQ/L-%-% IV SOLN
INTRAVENOUS | Status: DC
  Administered 2014-05-11 – 2014-05-13 (×5): via INTRAVENOUS
  Filled 2014-05-10 (×8): qty 1000

## 2014-05-10 MED ORDER — ASPIRIN EC 325 MG PO TBEC: 325.0000 mg | DELAYED_RELEASE_TABLET | Freq: Two times a day (BID) | ORAL | Status: DC

## 2014-05-10 MED ORDER — PROPOFOL 10 MG/ML IV BOLUS
INTRAVENOUS | Status: AC
  Filled 2014-05-10: qty 20

## 2014-05-10 MED ORDER — MELATONIN 3 MG PO TABS: 1.0000 | ORAL_TABLET | Freq: Every day | ORAL | Status: DC

## 2014-05-10 MED ORDER — METOCLOPRAMIDE HCL 5 MG/ML IJ SOLN
5.0000 mg | Freq: Three times a day (TID) | INTRAMUSCULAR | Status: DC | PRN
Start: 2014-05-10 — End: 2014-05-13

## 2014-05-10 MED ORDER — EPHEDRINE SULFATE 50 MG/ML IJ SOLN
INTRAMUSCULAR | Status: AC
  Filled 2014-05-10: qty 1

## 2014-05-10 MED ORDER — HYDROMORPHONE HCL 1 MG/ML IJ SOLN
INTRAMUSCULAR | Status: AC
  Filled 2014-05-10: qty 2

## 2014-05-10 MED ORDER — DOCUSATE SODIUM 100 MG PO CAPS
100.0000 mg | ORAL_CAPSULE | Freq: Two times a day (BID) | ORAL | Status: DC
  Administered 2014-05-10 – 2014-05-13 (×5): 100 mg via ORAL
  Filled 2014-05-10 (×7): qty 1

## 2014-05-10 MED ORDER — FENTANYL CITRATE 0.05 MG/ML IJ SOLN
INTRAMUSCULAR | Status: DC | PRN
  Administered 2014-05-10: 50 ug via INTRAVENOUS
  Administered 2014-05-10: 25 ug via INTRAVENOUS
  Administered 2014-05-10: 100 ug via INTRAVENOUS
  Administered 2014-05-10: 50 ug via INTRAVENOUS
  Administered 2014-05-10: 25 ug via INTRAVENOUS

## 2014-05-10 MED ORDER — HYDROCODONE-ACETAMINOPHEN 5-325 MG PO TABS
1.0000 | ORAL_TABLET | ORAL | Status: DC | PRN
  Administered 2014-05-10: 2 via ORAL
  Administered 2014-05-12: 1 via ORAL
  Filled 2014-05-10: qty 1
  Filled 2014-05-10: qty 2

## 2014-05-10 MED ORDER — DEXAMETHASONE SODIUM PHOSPHATE 4 MG/ML IJ SOLN
INTRAMUSCULAR | Status: AC
  Filled 2014-05-10: qty 2

## 2014-05-10 MED ORDER — OXYCODONE-ACETAMINOPHEN 5-325 MG PO TABS
1.0000 | ORAL_TABLET | ORAL | Status: DC | PRN
  Administered 2014-05-10 – 2014-05-13 (×11): 2 via ORAL
  Filled 2014-05-10 (×11): qty 2

## 2014-05-10 MED ORDER — 0.9 % SODIUM CHLORIDE (POUR BTL) OPTIME
TOPICAL | Status: DC | PRN
  Administered 2014-05-10: 1000 mL

## 2014-05-10 MED ORDER — OXYCODONE HCL 5 MG/5ML PO SOLN: 5.0000 mg | Freq: Once | ORAL | Status: DC | PRN

## 2014-05-10 MED ORDER — DEXAMETHASONE SODIUM PHOSPHATE 10 MG/ML IJ SOLN
INTRAMUSCULAR | Status: DC | PRN
  Administered 2014-05-10: 10 mg via INTRAVENOUS

## 2014-05-10 MED ORDER — OXYCODONE-ACETAMINOPHEN 5-325 MG PO TABS: 1.0000 | ORAL_TABLET | Freq: Four times a day (QID) | ORAL | Status: DC | PRN

## 2014-05-10 MED ORDER — MIDAZOLAM HCL 2 MG/2ML IJ SOLN
INTRAMUSCULAR | Status: AC
  Filled 2014-05-10: qty 2

## 2014-05-10 MED ORDER — HYDROMORPHONE HCL 1 MG/ML IJ SOLN
0.2500 mg | INTRAMUSCULAR | Status: DC | PRN
  Administered 2014-05-10 (×4): 0.5 mg via INTRAVENOUS

## 2014-05-10 MED ORDER — OXYCODONE HCL 5 MG PO TABS: 5.0000 mg | ORAL_TABLET | Freq: Once | ORAL | Status: DC | PRN

## 2014-05-10 MED ORDER — ONDANSETRON HCL 4 MG/2ML IJ SOLN
INTRAMUSCULAR | Status: DC | PRN
  Administered 2014-05-10: 4 mg via INTRAVENOUS

## 2014-05-10 MED ORDER — LIDOCAINE HCL (CARDIAC) 20 MG/ML IV SOLN
INTRAVENOUS | Status: DC | PRN
  Administered 2014-05-10: 40 mg via INTRAVENOUS

## 2014-05-10 MED ORDER — DEXTROSE-NACL 5-0.45 % IV SOLN: INTRAVENOUS | Status: DC

## 2014-05-10 MED ORDER — ONDANSETRON HCL 4 MG PO TABS: 4.0000 mg | ORAL_TABLET | Freq: Four times a day (QID) | ORAL | Status: DC | PRN

## 2014-05-10 MED ORDER — LACTATED RINGERS IV SOLN
INTRAVENOUS | Status: DC
  Administered 2014-05-10: 11:00:00 via INTRAVENOUS

## 2014-05-10 MED ORDER — MIDAZOLAM HCL 5 MG/5ML IJ SOLN
INTRAMUSCULAR | Status: DC | PRN
  Administered 2014-05-10: 2 mg via INTRAVENOUS

## 2014-05-10 MED ORDER — DIAZEPAM 5 MG PO TABS
5.0000 mg | ORAL_TABLET | Freq: Three times a day (TID) | ORAL | Status: DC | PRN
  Administered 2014-05-11 – 2014-05-13 (×5): 5 mg via ORAL
  Filled 2014-05-10 (×5): qty 1

## 2014-05-10 MED ORDER — METHOCARBAMOL 500 MG PO TABS
500.0000 mg | ORAL_TABLET | Freq: Four times a day (QID) | ORAL | Status: DC | PRN
  Administered 2014-05-11 – 2014-05-13 (×3): 500 mg via ORAL
  Filled 2014-05-10 (×3): qty 1

## 2014-05-10 MED ORDER — LURASIDONE HCL 20 MG PO TABS
1.0000 | ORAL_TABLET | Freq: Every day | ORAL | Status: DC
  Administered 2014-05-11 – 2014-05-13 (×3): 20 mg via ORAL
  Filled 2014-05-10 (×4): qty 1

## 2014-05-10 MED ORDER — LAMOTRIGINE 100 MG PO TABS
100.0000 mg | ORAL_TABLET | Freq: Two times a day (BID) | ORAL | Status: DC
  Administered 2014-05-10 – 2014-05-13 (×6): 100 mg via ORAL
  Filled 2014-05-10 (×7): qty 1

## 2014-05-10 MED ORDER — LACTATED RINGERS IV SOLN
INTRAVENOUS | Status: DC | PRN
  Administered 2014-05-10 (×2): via INTRAVENOUS

## 2014-05-10 MED ORDER — GUANFACINE HCL 2 MG PO TABS
2.0000 mg | ORAL_TABLET | Freq: Every day | ORAL | Status: DC
  Administered 2014-05-10 – 2014-05-12 (×3): 2 mg via ORAL
  Filled 2014-05-10 (×5): qty 1

## 2014-05-10 MED ORDER — HYDROMORPHONE HCL 1 MG/ML IJ SOLN
0.5000 mg | INTRAMUSCULAR | Status: DC | PRN
  Administered 2014-05-10 – 2014-05-13 (×8): 1 mg via INTRAVENOUS
  Filled 2014-05-10 (×8): qty 1

## 2014-05-10 MED ORDER — PROPOFOL 10 MG/ML IV BOLUS
INTRAVENOUS | Status: DC | PRN
  Administered 2014-05-10: 200 mg via INTRAVENOUS

## 2014-05-10 MED ORDER — METHOCARBAMOL 1000 MG/10ML IJ SOLN
500.0000 mg | Freq: Four times a day (QID) | INTRAVENOUS | Status: DC | PRN
  Filled 2014-05-10: qty 5

## 2014-05-10 MED ORDER — ASPIRIN 325 MG PO TABS
325.0000 mg | ORAL_TABLET | Freq: Two times a day (BID) | ORAL | Status: DC
  Administered 2014-05-11 – 2014-05-13 (×5): 325 mg via ORAL
  Filled 2014-05-10 (×8): qty 1

## 2014-05-10 MED ORDER — FENTANYL CITRATE 0.05 MG/ML IJ SOLN
INTRAMUSCULAR | Status: AC
  Filled 2014-05-10: qty 5

## 2014-05-10 MED ORDER — ONDANSETRON HCL 4 MG/2ML IJ SOLN: 4.0000 mg | Freq: Four times a day (QID) | INTRAMUSCULAR | Status: DC | PRN

## 2014-05-10 MED ORDER — STERILE WATER FOR INJECTION IJ SOLN
INTRAMUSCULAR | Status: AC
  Filled 2014-05-10: qty 10

## 2014-05-10 MED ORDER — OXCARBAZEPINE 300 MG PO TABS
300.0000 mg | ORAL_TABLET | Freq: Two times a day (BID) | ORAL | Status: DC
  Administered 2014-05-10 – 2014-05-13 (×6): 300 mg via ORAL
  Filled 2014-05-10 (×7): qty 1

## 2014-05-10 MED ORDER — DEXAMETHASONE SODIUM PHOSPHATE 10 MG/ML IJ SOLN
INTRAMUSCULAR | Status: AC
  Filled 2014-05-10: qty 1

## 2014-05-10 MED ORDER — GLYCOPYRROLATE 0.2 MG/ML IJ SOLN
INTRAMUSCULAR | Status: DC | PRN
  Administered 2014-05-10: 0.2 mg via INTRAVENOUS

## 2014-05-10 MED ORDER — METOCLOPRAMIDE HCL 10 MG PO TABS: 5.0000 mg | ORAL_TABLET | Freq: Three times a day (TID) | ORAL | Status: DC | PRN

## 2014-05-10 MED ORDER — GABAPENTIN 600 MG PO TABS
600.0000 mg | ORAL_TABLET | Freq: Two times a day (BID) | ORAL | Status: DC
  Administered 2014-05-10 – 2014-05-13 (×6): 600 mg via ORAL
  Filled 2014-05-10 (×7): qty 1

## 2014-05-10 SURGICAL SUPPLY — 73 items
APPLICATOR CHLORAPREP 3ML ORNG (MISCELLANEOUS) ×3 IMPLANT
BANDAGE ELASTIC 6 VELCRO ST LF (GAUZE/BANDAGES/DRESSINGS) ×3 IMPLANT
BIT DRILL 2.5 X LONG (BIT) ×1
BIT DRILL 2.5X110 QC LCP DISP (BIT) ×3 IMPLANT
BIT DRILL CALI LONG 2.8MM (BIT) ×1 IMPLANT
BIT DRILL Q COUPLING 4.5 (BIT) ×3 IMPLANT
BIT DRILL QC 3.5X110 (BIT) ×3 IMPLANT
BIT DRILL X LONG 2.5 (BIT) ×1 IMPLANT
BLADE SURG 15 STRL LF DISP TIS (BLADE) ×1 IMPLANT
BLADE SURG 15 STRL SS (BLADE) ×2
BLADE SURG ROTATE 9660 (MISCELLANEOUS) IMPLANT
BNDG COHESIVE 4X5 TAN STRL (GAUZE/BANDAGES/DRESSINGS) ×3 IMPLANT
BNDG GAUZE ELAST 4 BULKY (GAUZE/BANDAGES/DRESSINGS) ×3 IMPLANT
CHLORAPREP W/TINT 26ML (MISCELLANEOUS) ×3 IMPLANT
COVER SURGICAL LIGHT HANDLE (MISCELLANEOUS) ×3 IMPLANT
CUFF TOURNIQUET SINGLE 34IN LL (TOURNIQUET CUFF) ×3 IMPLANT
DRAPE C-ARM 42X72 X-RAY (DRAPES) ×3 IMPLANT
DRAPE ORTHO SPLIT 77X108 STRL (DRAPES) ×4
DRAPE PROXIMA HALF (DRAPES) ×6 IMPLANT
DRAPE SURG ORHT 6 SPLT 77X108 (DRAPES) ×2 IMPLANT
DRAPE U-SHAPE 47X51 STRL (DRAPES) ×3 IMPLANT
DRILL BIT CALI LONG 2.8MM (BIT) ×3
DRILL BIT X LONG 2.5 (BIT) ×2
DRSG ADAPTIC 3X8 NADH LF (GAUZE/BANDAGES/DRESSINGS) ×3 IMPLANT
DURAPREP 26ML APPLICATOR (WOUND CARE) IMPLANT
ELECT REM PT RETURN 9FT ADLT (ELECTROSURGICAL) ×3
ELECTRODE REM PT RTRN 9FT ADLT (ELECTROSURGICAL) ×1 IMPLANT
GAUZE SPONGE 4X4 12PLY STRL (GAUZE/BANDAGES/DRESSINGS) ×6 IMPLANT
GLOVE BIO SURGEON STRL SZ7.5 (GLOVE) ×3 IMPLANT
GLOVE BIO SURGEON STRL SZ8.5 (GLOVE) ×3 IMPLANT
GLOVE BIOGEL PI IND STRL 6.5 (GLOVE) ×1 IMPLANT
GLOVE BIOGEL PI IND STRL 8 (GLOVE) ×1 IMPLANT
GLOVE BIOGEL PI IND STRL 9 (GLOVE) ×1 IMPLANT
GLOVE BIOGEL PI INDICATOR 6.5 (GLOVE) ×2
GLOVE BIOGEL PI INDICATOR 8 (GLOVE) ×2
GLOVE BIOGEL PI INDICATOR 9 (GLOVE) ×2
GLOVE INDICATOR 6.5 STRL GRN (GLOVE) ×3 IMPLANT
GOWN STRL REUS W/ TWL LRG LVL3 (GOWN DISPOSABLE) ×2 IMPLANT
GOWN STRL REUS W/ TWL XL LVL3 (GOWN DISPOSABLE) ×2 IMPLANT
GOWN STRL REUS W/TWL LRG LVL3 (GOWN DISPOSABLE) ×4
GOWN STRL REUS W/TWL XL LVL3 (GOWN DISPOSABLE) ×4
IMMOBILIZER KNEE 22 UNIV (SOFTGOODS) ×3 IMPLANT
KIT BASIN OR (CUSTOM PROCEDURE TRAY) ×3 IMPLANT
KIT ROOM TURNOVER OR (KITS) ×3 IMPLANT
MANIFOLD NEPTUNE II (INSTRUMENTS) ×3 IMPLANT
NEEDLE 22X1 1/2 (OR ONLY) (NEEDLE) ×3 IMPLANT
NS IRRIG 1000ML POUR BTL (IV SOLUTION) ×3 IMPLANT
PACK GENERAL/GYN (CUSTOM PROCEDURE TRAY) ×3 IMPLANT
PAD ARMBOARD 7.5X6 YLW CONV (MISCELLANEOUS) ×6 IMPLANT
PADDING CAST COTTON 6X4 STRL (CAST SUPPLIES) ×3 IMPLANT
PLATE 6H RT 3.5MM MED PROX TIB (Plate) ×3 IMPLANT
SCREW CANCEL 16T 6.5X50 (Screw) ×3 IMPLANT
SCREW CANCEL 16T 6.5X60 (Screw) ×3 IMPLANT
SCREW CANCEL 16T 6.5X65 (Screw) ×3 IMPLANT
SCREW CORTEX 3.5 28MM (Screw) ×4 IMPLANT
SCREW CORTEX 3.5 30MM (Screw) ×2 IMPLANT
SCREW CORTEX 3.5 36MM (Screw) ×2 IMPLANT
SCREW LOCK CORT ST 3.5X28 (Screw) ×2 IMPLANT
SCREW LOCK CORT ST 3.5X30 (Screw) ×1 IMPLANT
SCREW LOCK CORT ST 3.5X36 (Screw) ×1 IMPLANT
SCREW LOCKING SLF TAP 3.5X70MM (Screw) ×6 IMPLANT
SCREW LOCKING SLF TAP 3.5X75MM (Screw) ×6 IMPLANT
SCREW LOCKING SLF TAP 3.5X80MM (Screw) ×6 IMPLANT
SCREW LOCKING SLF TAP 3.5X85MM (Screw) ×3 IMPLANT
SPONGE GAUZE 4X4 12PLY STER LF (GAUZE/BANDAGES/DRESSINGS) ×3 IMPLANT
STAPLER VISISTAT 35W (STAPLE) IMPLANT
STOCKINETTE IMPERVIOUS 9X36 MD (GAUZE/BANDAGES/DRESSINGS) ×3 IMPLANT
SUT VIC AB 0 CTB1 27 (SUTURE) IMPLANT
SUT VIC AB 2-0 CTB1 (SUTURE) IMPLANT
SYR 20ML ECCENTRIC (SYRINGE) ×3 IMPLANT
TOWEL OR 17X24 6PK STRL BLUE (TOWEL DISPOSABLE) ×3 IMPLANT
TOWEL OR 17X26 10 PK STRL BLUE (TOWEL DISPOSABLE) ×3 IMPLANT
WATER STERILE IRR 1000ML POUR (IV SOLUTION) ×3 IMPLANT

## 2014-05-10 NOTE — Progress Notes (Signed)

## 2014-05-10 NOTE — Interval H&P Note (Signed)
History and Physical Interval Note:  05/10/2014 11:51 AM  Louis [name] L Balbi  has presented today for surgery, with the diagnosis of right proximal condylar fracture  The various methods of treatment have been discussed with the patient and family. After consideration of risks, benefits and other options for treatment, the patient has consented to  Procedure(s): OPEN REDUCTION INTERNAL FIXATION (ORIF)RIGHT PROXIMAL TIBIA FRACTURE (Right) as a surgical intervention .  The patient's history has been reviewed, patient examined, no change in status, stable for surgery.  I have reviewed the patient's chart and labs.  Questions were answered to the patient's satisfaction.     Nestor LewandowskyOWAN,Genieve Ramaswamy J

## 2014-05-10 NOTE — Transfer of Care (Signed)
Immediate Anesthesia Transfer of Care Note  Patient: Louis [name]  Procedure(s) Performed: Procedure(s): OPEN REDUCTION INTERNAL FIXATION (ORIF)RIGHT PROXIMAL TIBIA FRACTURE (Right)  Patient Location: PACU  Anesthesia Type:General  Level of Consciousness: awake, alert , oriented and patient cooperative  Airway & Oxygen Therapy: Patient Spontanous Breathing and Patient connected to nasal cannula oxygen  Post-op Assessment: Report given to PACU RN and Post -op Vital signs reviewed and stable  Post vital signs: Reviewed and stable  Complications: No apparent anesthesia complications

## 2014-05-10 NOTE — Anesthesia Postprocedure Evaluation (Signed)
  Anesthesia Post-op Note  Patient: Louis [name]  Procedure(s) Performed: Procedure(s): OPEN REDUCTION INTERNAL FIXATION (ORIF)RIGHT PROXIMAL TIBIA FRACTURE (Right)  Patient Location: PACU  Anesthesia Type:General  Level of Consciousness: awake  Airway and Oxygen Therapy: Patient Spontanous Breathing  Post-op Pain: mild  Post-op Assessment: Post-op Vital signs reviewed  Post-op Vital Signs: Reviewed  Last Vitals:  Filed Vitals:   05/10/14 1519  BP: 160/108  Pulse: 67  Temp:   Resp: 26    Complications: No apparent anesthesia complications
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
## 2014-05-10 NOTE — Anesthesia Preprocedure Evaluation (Signed)
Anesthesia Evaluation  Patient identified by MRN, date of birth, ID band Patient awake    Reviewed: Allergy & Precautions, H&P , NPO status , Patient's Chart, lab work & pertinent test results  Airway Mallampati: II  TM Distance: >3 FB Neck ROM: Full    Dental no notable dental hx. (+) Teeth Intact, Dental Advisory Given   Pulmonary neg pulmonary ROS,  breath sounds clear to auscultation  Pulmonary exam normal       Cardiovascular hypertension, On Medications negative cardio ROS  Rhythm:Regular Rate:Normal     Neuro/Psych  Headaches, Anxiety Depression Bipolar Disorder    GI/Hepatic negative GI ROS, Neg liver ROS,   Endo/Other  negative endocrine ROS  Renal/GU negative Renal ROS  negative genitourinary   Musculoskeletal  (+) Arthritis -,   Abdominal   Peds  Hematology negative hematology ROS (+)   Anesthesia Other Findings   Reproductive/Obstetrics negative OB ROS                             Anesthesia Physical Anesthesia Plan  ASA: II  Anesthesia Plan: General   Post-op Pain Management:    Induction: Intravenous  Airway Management Planned: LMA and Oral ETT  Additional Equipment:   Intra-op Plan:   Post-operative Plan: Extubation in OR  Informed Consent: I have reviewed the patients History and Physical, chart, labs and discussed the procedure including the risks, benefits and alternatives for the proposed anesthesia with the patient or authorized representative who has indicated his/her understanding and acceptance.   Dental advisory given  Plan Discussed with: CRNA  Anesthesia Plan Comments:         Anesthesia Quick Evaluation
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
## 2014-05-11 ENCOUNTER — Encounter (HOSPITAL_COMMUNITY): Payer: Self-pay | Admitting: General Practice
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
## 2014-05-11 NOTE — Progress Notes (Signed)
Orthopedic Tech Progress Note Patient Details:  Louis [name] 1971/06/19 [id number] No OHF available at this time. When frame becomes available patient will receive one.  Patient ID: Louis [name], male   DOB: 1971/06/19, 43 y.o.   MRN: [id number]   [name] 05/11/2014, 9:35 AM

## 2014-05-11 NOTE — Plan of Care (Signed)
Problem: Phase II Progression Outcomes Goal: Tolerating diet Outcome: Completed/Met Date Met:  05/11/14     

## 2014-05-11 NOTE — Progress Notes (Signed)
Utilization review completed.  
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
## 2014-05-12 NOTE — Plan of Care (Signed)
Problem: Phase II Progression Outcomes Goal: Post op CMS Neurovascular WDL Outcome: Completed/Met Date Met:  05/12/14
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
## 2014-05-12 NOTE — Clinical Social Work Psychosocial (Deleted)
Deleted
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
## 2014-05-12 NOTE — Progress Notes (Signed)
Physical Therapy Treatment Patient Details Name: Louis [name] MRN: [id number] DOB: 02-27-1971 Today's Date: 05/12/2014    History of Present Illness 43 y.o. male [facility] for s/p ORIF R proximal tibial bicondylar fx.  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    PT Comments    Pt is progressing well with his mobility. He is much safer on his axillary crutches than he is with a RW, however, he is still unsteady requiring min guard assist during gait and min assist on stairs (he reports he frequently does stairs during community access).  He has difficulty completing his HEP on his own and would continue to benefit from SNF level rehab before returning home unassisted.  If he refuses or does not qualify max HH services would be recommended.  PT will continue to follow acutely.   Follow Up Recommendations  SNF     Equipment Recommendations  None recommended by PT (pt is safer on his own crutches)    Recommendations for Other Services   NA     Precautions / Restrictions Precautions Precautions: Fall Precaution Comments: Pt with h/o multiple falls while using crutches PTA.  Restrictions RLE Weight Bearing: Partial weight bearing RLE Partial Weight Bearing Percentage or Pounds: 20 Other Position/Activity Restrictions: gentle ROM to knee ok.     Mobility  Bed Mobility Overal bed mobility: Modified Independent Bed Mobility: Supine to Sit     Supine to sit: Modified independent (Device/Increase time);HOB elevated     General bed mobility comments: Pt using railing for leverage  Transfers Overall transfer level: Needs assistance Equipment used: Crutches Transfers: Sit to/from Stand Sit to Stand: Supervision         General transfer comment: supervision for safety as pt is unsteady on his feet and self selecting to keep right foot NWB making  him even more unsteady  Ambulation/Gait Ambulation/Gait assistance: Min guard Ambulation Distance (Feet): 200 Feet Assistive device: Crutches Gait Pattern/deviations:  (hop to) Gait velocity: fast Gait velocity interpretation: at or above normal speed for age/gender General Gait Details: Pt continues to take very large hop steps with axillary crutches at times getting a little unstable requiring min guard assist to right himself.  Verbal cues of encouragement that he can slow down and WB 10-20 # for balance on his right foot, but pt elected to continue to be NWB here.    Stairs Stairs: Yes Stairs assistance: Min assist Stair Management: No rails;Step to pattern;Forwards;With crutches (hop to gait pattern ) Number of Stairs: 5 (x2) General stair comments: Pt demonstrating hop to gait pattern requiring verbal cues for safety, correct sequence of legs and crutches.  Min assit for two significant LOB requiring pt to hold to the hadrails on the stairs and min assist from therapist to prevent posterior LOB (both while hopping down the stairs).          Balance Overall balance assessment: Needs assistance Sitting-balance support: Feet supported;No upper extremity supported Sitting balance-Leahy Scale: Good     Standing balance support: Bilateral upper extremity supported;Single extremity supported;No upper extremity supported Standing balance-Leahy Scale: Fair                      Cognition Arousal/Alertness: Awake/alert Behavior During Therapy: WFL for tasks assessed/performed Overall Cognitive Status: Within Functional Limits for tasks assessed  Exercises Total Joint Exercises Ankle Circles/Pumps: AROM;Both;10 reps;Supine Quad Sets: AROM;Right;10 reps;Supine Towel Squeeze: AROM;Both;10 reps;Supine Short Arc Quad: AAROM;Right;10 reps;Supine Heel Slides: AAROM;Right;10 reps;Supine Hip ABduction/ADduction: AAROM;Right;10 reps;Supine Straight  Leg Raises: AAROM;Right;10 reps;Supine Other Exercises Other Exercises: Reviewed HEP program given to pt yesterday using sheet to have pt help lift his own leg with AAROM exercises.  PT wanted to make sure pt could preform these on his own.         Pertinent Vitals/Pain Pain Assessment: Faces Faces Pain Scale: Hurts even more (with standing, gait, and exercises.) Pain Location: right leg Pain Descriptors / Indicators: Aching;Burning Pain Intervention(s): Limited activity within patient's tolerance;Monitored during session;Repositioned;Ice applied     PT Goals (current goals can now be found in the care plan section) Acute Rehab PT Goals Patient Stated Goal: to go home Progress towards PT goals: Progressing toward goals    Frequency  Min 5X/week    PT Plan Current plan remains appropriate       End of Session Equipment Utilized During Treatment: Gait belt;Right knee immobilizer Activity Tolerance: Patient limited by pain Patient left: in chair;with call bell/phone within reach     Time: [id number] PT Time Calculation (min) (ACUTE ONLY): 43 min  Charges:  $Gait Training: 8-22 mins $Therapeutic Exercise: 23-37 mins                      Orvile Corona B. Lekia Nier, PT, DPT [phone]   05/12/2014, 12:50 PM

## 2014-05-12 NOTE — Plan of Care (Signed)
Problem: Phase III Progression Outcomes Goal: Pain controlled on oral analgesia Outcome: Completed/Met Date Met:  05/12/14

## 2014-05-12 NOTE — Clinical Social Work Placement (Signed)
Clinical Social Work Department CLINICAL SOCIAL WORK PLACEMENT NOTE 05/12/2014  Patient:  Louis [name],Louis [name]  Account Number:  [id number] Admit date:  05/10/2014  Clinical Social Worker:  Read DriversEGINA Jerricka Carvey, LCSWA  Date/time:  05/12/2014 12:30 PM  Clinical Social Work is seeking post-discharge placement for this patient at the following level of care:   SKILLED NURSING   (*CSW will update this form in Epic as items are completed)   05/12/2014  Patient/family provided with [facility] of Clinical Social Work's list of facilities offering this level of care within the geographic area requested by the patient (or if unable, by the patient's family).  05/12/2014  Patient/family informed of their freedom to choose among providers that offer the needed level of care, that participate in Medicare, Medicaid or managed care program needed by the patient, have an available bed and are willing to accept the patient.  05/12/2014  Patient/family informed of [facility]' ownership interest in [facility], as well as of the fact that they are under no obligation to receive care at this facility.  PASARR submitted to EDS on 05/12/2014 PASARR number received on 05/12/2014  FL2 transmitted to all facilities in geographic area requested by pt/family on  05/12/2014 FL2 transmitted to all facilities within larger geographic area on 05/12/2014  Patient informed that his/her managed care company has contracts with or will negotiate with  certain facilities, including the following:     Patient/family informed of bed offers received:   Patient chooses bed at  Physician recommends and patient chooses bed at    Patient to be transferred to  on   Patient to be transferred to facility by  Patient and family notified of transfer on  Name of family member notified:    The following physician request were entered in Epic:   Additional Comments:  Vickii PennaGina Belem Hintze, LCSWA [phone]  Psychiatric &  Orthopedics (5N 1-16) Clinical Social Worker

## 2014-05-13 NOTE — Progress Notes (Signed)
Subjective: 3 Days Post-Op Procedure(s) (LRB): OPEN REDUCTION INTERNAL FIXATION (ORIF)RIGHT PROXIMAL TIBIA FRACTURE (Right)  Activity level:  Touch down weight bearing Diet tolerance:  Eating well Voiding:  ok Patient reports pain as mild and moderate.    Objective: Vital signs in last 24 hours: Temp:  [98.3 F (36.8 C)-99.4 F (37.4 C)] 99.4 F (37.4 C) (11/14 0552) Pulse Rate:  [81-94] 86 (11/14 0552) Resp:  [16] 16 (11/14 0552) BP: (97-120)/(59-75) 120/75 mmHg (11/14 0552) SpO2:  [94 %-98 %] 94 % (11/14 0552) Weight:  [88.7 kg (195 lb 8.8 oz)] 88.7 kg (195 lb 8.8 oz) (11/13 2137)  Labs: No results for input(s): HGB in the last 72 hours. No results for input(s): WBC, RBC, HCT, PLT in the last 72 hours. No results for input(s): NA, K, CL, CO2, BUN, CREATININE, GLUCOSE, CALCIUM in the last 72 hours. No results for input(s): LABPT, INR in the last 72 hours.  Physical Exam:  Neurologically intact ABD soft Neurovascular intact Sensation intact distally Intact pulses distally Dorsiflexion/Plantar flexion intact Incision: dressing C/D/I No cellulitis present Compartment soft  Assessment/Plan:  3 Days Post-Op Procedure(s) (LRB): OPEN REDUCTION INTERNAL FIXATION (ORIF)RIGHT PROXIMAL TIBIA FRACTURE (Right) Advance diet Up with therapy Discharge to SNF  If bed available today per recommendation of PT and CSW. Patient states that he just wants to go home and that he is home sick. I informed him about what the recommendations were from our standpoint and he asked if he could discuss it further with the social worker. We will get the social worker to talk with him about this.  Continue on ASA for DVT prevention. Follow up in the office as scheduled with Dr. Turner Danielsowan.    Louis [name], Louis [name] 05/13/2014, 8:10 AM

## 2014-05-13 NOTE — Clinical Social Work Note (Signed)
Weekend CS[facility] and is set-up with home health services. Weekend CSW signing off.  Marcelline Deistmily Clifton Kovacic, LCSWA [phone]) Licensed Clinical Social Worker Orthopedics [phone]([id number]) and Surgical [phone]([id number])

## 2014-05-13 NOTE — Plan of Care (Signed)
Problem: Phase II Progression Outcomes Goal: Bed to chair transfers BID Outcome: Completed/Met Date Met:  05/13/14 Goal: Discharge plan established Outcome: Completed/Met Date Met:  05/13/14 Goal: Other Phase II Outcomes/Goals Outcome: Completed/Met Date Met:  05/13/14  Problem: Phase III Progression Outcomes Goal: Ambulate BID Outcome: Completed/Met Date Met:  05/13/14 Goal: Dressing/splint clean, dry, intact Outcome: Completed/Met Date Met:  05/13/14 Goal: Maintains weight - bearing per MD orders Outcome: Completed/Met Date Met:  05/13/14 Goal: Discharge plan remains appropriate-arrangements made Outcome: Completed/Met Date Met:  05/13/14 Goal: Other Phase III Outcomes/Goals Outcome: Completed/Met Date Met:  05/13/14

## 2014-05-13 NOTE — Progress Notes (Signed)
PT Cancellation Note  Patient Details Name: Louis [name] MRN: [id number] DOB: 09-Jan-1971   Cancelled Treatment:    Reason Eval/Treat Not Completed: Patient declined, no reason specified;Other (comment) ("I am good")   [name], [name] 05/13/2014, 1:13 PM   [name], PT MS Acute Rehab Dept. Number: [phone]

## 2014-06-06 ENCOUNTER — Ambulatory Visit: Payer: No Typology Code available for payment source | Attending: Orthopedic Surgery | Admitting: Physical Therapy

## 2014-06-06 ENCOUNTER — Telehealth: Payer: Self-pay | Admitting: *Deleted

## 2014-06-06 ENCOUNTER — Encounter: Payer: Self-pay | Admitting: Physical Therapy

## 2014-06-06 DIAGNOSIS — G8929 Other chronic pain: Secondary | ICD-10-CM | POA: Insufficient documentation

## 2014-06-06 DIAGNOSIS — S82141D Displaced bicondylar fracture of right tibia, subsequent encounter for closed fracture with routine healing: Secondary | ICD-10-CM | POA: Insufficient documentation

## 2014-06-06 DIAGNOSIS — M79601 Pain in right arm: Secondary | ICD-10-CM | POA: Diagnosis not present

## 2014-06-06 DIAGNOSIS — M545 Low back pain: Secondary | ICD-10-CM | POA: Diagnosis not present

## 2014-06-06 DIAGNOSIS — Z5189 Encounter for other specified aftercare: Secondary | ICD-10-CM | POA: Diagnosis not present

## 2014-06-06 DIAGNOSIS — S82891A Other fracture of right lower leg, initial encounter for closed fracture: Secondary | ICD-10-CM

## 2014-06-06 NOTE — Telephone Encounter (Signed)
appts made and printed...td 
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
## 2014-06-06 NOTE — Patient Instructions (Addendum)
   Quad sets, HS sets, ankle pumps, SAQ 5-10 reps 2-3x/day;  Ice and elevate for edema control

## 2014-06-20 ENCOUNTER — Encounter: Payer: Medicaid Other | Admitting: Physical Therapy

## 2014-10-02 ENCOUNTER — Emergency Department (HOSPITAL_COMMUNITY)
Admission: EM | Admit: 2014-10-02 | Discharge: 2014-10-02 | Disposition: A | Discharge status: Home / Self Care | Payer: Medicaid Other | Source: Home / Self Care | Attending: Family Medicine | Admitting: Family Medicine

## 2014-10-02 ENCOUNTER — Encounter (HOSPITAL_COMMUNITY): Payer: Self-pay | Admitting: Emergency Medicine

## 2014-10-02 DIAGNOSIS — M79604 Pain in right leg: Secondary | ICD-10-CM | POA: Diagnosis not present

## 2014-10-02 DIAGNOSIS — M79601 Pain in right arm: Secondary | ICD-10-CM

## 2014-10-02 DIAGNOSIS — G8929 Other chronic pain: Secondary | ICD-10-CM | POA: Diagnosis not present

## 2014-10-02 MED ORDER — DICLOFENAC POTASSIUM 50 MG PO TABS: 50.0000 mg | ORAL_TABLET | Freq: Three times a day (TID) | ORAL | Status: DC

## 2014-10-02 NOTE — Discharge Instructions (Signed)
See your doctor for pain management referral or dr Turner Danielsrowan for re-eval of leg problem.
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
## 2014-11-21 ENCOUNTER — Encounter: Payer: Self-pay | Admitting: Physical Medicine & Rehabilitation

## 2015-01-18 ENCOUNTER — Encounter: Payer: Self-pay | Admitting: Physical Medicine & Rehabilitation

## 2015-01-19 ENCOUNTER — Other Ambulatory Visit: Payer: Self-pay | Admitting: Physical Medicine & Rehabilitation

## 2015-01-19 ENCOUNTER — Encounter: Payer: Medicaid Other | Attending: Physical Medicine & Rehabilitation | Admitting: Physical Medicine & Rehabilitation

## 2015-01-19 ENCOUNTER — Encounter: Payer: Self-pay | Admitting: Physical Medicine & Rehabilitation

## 2015-01-19 VITALS — BP 122/70 | HR 83 | Resp 14

## 2015-01-19 DIAGNOSIS — Z79899 Other long term (current) drug therapy: Secondary | ICD-10-CM

## 2015-01-19 DIAGNOSIS — M545 Low back pain: Secondary | ICD-10-CM | POA: Diagnosis not present

## 2015-01-19 DIAGNOSIS — G43909 Migraine, unspecified, not intractable, without status migrainosus: Secondary | ICD-10-CM | POA: Diagnosis not present

## 2015-01-19 DIAGNOSIS — M79604 Pain in right leg: Secondary | ICD-10-CM | POA: Diagnosis present

## 2015-01-19 DIAGNOSIS — F5104 Psychophysiologic insomnia: Secondary | ICD-10-CM | POA: Diagnosis not present

## 2015-01-19 DIAGNOSIS — F4310 Post-traumatic stress disorder, unspecified: Secondary | ICD-10-CM | POA: Diagnosis not present

## 2015-01-19 DIAGNOSIS — G4700 Insomnia, unspecified: Secondary | ICD-10-CM | POA: Diagnosis not present

## 2015-01-19 DIAGNOSIS — Z5181 Encounter for therapeutic drug level monitoring: Secondary | ICD-10-CM

## 2015-01-19 DIAGNOSIS — F329 Major depressive disorder, single episode, unspecified: Secondary | ICD-10-CM

## 2015-01-19 DIAGNOSIS — M25561 Pain in right knee: Secondary | ICD-10-CM | POA: Insufficient documentation

## 2015-01-19 DIAGNOSIS — F319 Bipolar disorder, unspecified: Secondary | ICD-10-CM | POA: Insufficient documentation

## 2015-01-19 DIAGNOSIS — G8929 Other chronic pain: Secondary | ICD-10-CM | POA: Insufficient documentation

## 2015-01-19 DIAGNOSIS — I10 Essential (primary) hypertension: Secondary | ICD-10-CM | POA: Diagnosis not present

## 2015-01-19 DIAGNOSIS — S82141S Displaced bicondylar fracture of right tibia, sequela: Secondary | ICD-10-CM

## 2015-01-19 DIAGNOSIS — F32A Depression, unspecified: Secondary | ICD-10-CM

## 2015-01-19 DIAGNOSIS — G894 Chronic pain syndrome: Secondary | ICD-10-CM

## 2015-01-19 MED ORDER — CITALOPRAM HYDROBROMIDE 10 MG PO TABS: 10.0000 mg | ORAL_TABLET | Freq: Every day | ORAL | Status: DC

## 2015-01-19 MED ORDER — AMITRIPTYLINE HCL 25 MG PO TABS: 25.0000 mg | ORAL_TABLET | Freq: Every day | ORAL | Status: DC

## 2015-01-19 NOTE — Patient Instructions (Signed)
CONTACT DR. Turner Daniels FOR MOST RECENT XRAY AND OFFICE NOTES SO THAT I CAN REVIEW  BEGIN ELAVIL FOR ONE WEEK AND THEN BEGIN CELEXA.
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
## 2015-01-20 LAB — PMP ALCOHOL METABOLITE (ETG): Ethyl Glucuronide (EtG): NEGATIVE ng/mL

## 2015-01-24 LAB — TRAMADOL, URINE
N-DESMETHYL-CIS-TRAMADOL: 754 ng/mL — AB (ref <100)
TRAMADOL, URINE: 745 ng/mL — AB (ref <100)

## 2015-01-25 LAB — PRESCRIPTION MONITORING PROFILE (SOLSTAS)
Amphetamine/Meth: NEGATIVE ng/mL
BARBITURATE SCREEN, URINE: NEGATIVE ng/mL
Benzodiazepine Screen, Urine: NEGATIVE ng/mL
Buprenorphine, Urine: NEGATIVE ng/mL
CARISOPRODOL, URINE: NEGATIVE ng/mL
COCAINE METABOLITES: NEGATIVE ng/mL
CREATININE, URINE: 197.79 mg/dL (ref >20.0)
Cannabinoid Scrn, Ur: NEGATIVE ng/mL
ECSTASY: NEGATIVE ng/mL
Fentanyl, Ur: NEGATIVE ng/mL
Meperidine, Ur: NEGATIVE ng/mL
Methadone Screen, Urine: NEGATIVE ng/mL
NITRITES URINE, INITIAL: NEGATIVE ug/mL
OXYCODONE SCRN UR: NEGATIVE ng/mL
Opiate Screen, Urine: NEGATIVE ng/mL
PH URINE, INITIAL: 7.1 pH (ref 4.5–8.9)
PROPOXYPHENE: NEGATIVE ng/mL
TAPENTADOLUR: NEGATIVE ng/mL
Zolpidem, Urine: NEGATIVE ng/mL

## 2015-01-26 ENCOUNTER — Other Ambulatory Visit: Payer: Self-pay | Admitting: *Deleted

## 2015-01-26 DIAGNOSIS — F329 Major depressive disorder, single episode, unspecified: Secondary | ICD-10-CM

## 2015-01-26 DIAGNOSIS — S82141S Displaced bicondylar fracture of right tibia, sequela: Secondary | ICD-10-CM

## 2015-01-26 DIAGNOSIS — G4700 Insomnia, unspecified: Secondary | ICD-10-CM

## 2015-01-26 DIAGNOSIS — F32A Depression, unspecified: Secondary | ICD-10-CM

## 2015-01-26 DIAGNOSIS — F4310 Post-traumatic stress disorder, unspecified: Secondary | ICD-10-CM

## 2015-01-26 MED ORDER — AMITRIPTYLINE HCL 25 MG PO TABS: 25.0000 mg | ORAL_TABLET | Freq: Every day | ORAL | Status: DC

## 2015-01-26 MED ORDER — CITALOPRAM HYDROBROMIDE 10 MG PO TABS: 10.0000 mg | ORAL_TABLET | Freq: Every day | ORAL | Status: DC

## 2015-01-26 NOTE — Telephone Encounter (Signed)
Mr Louis [name] called and asked that his celexa and amitriptyline be sent to [facility].  This has been done.

## 2015-01-30 ENCOUNTER — Telehealth: Payer: Self-pay | Admitting: *Deleted

## 2015-01-30 MED ORDER — TRAMADOL HCL 50 MG PO TABS: ORAL_TABLET | ORAL | Status: DC

## 2015-01-30 NOTE — Telephone Encounter (Signed)
Called to pharmacy and Mr Jay notified

## 2015-01-30 NOTE — Telephone Encounter (Signed)
Tramadol  q6-8 prn #75, 0RF,  i believe he was taking 2-3 per day at home before.

## 2015-01-30 NOTE — Telephone Encounter (Signed)
Louis [name] has called about his tramadol being ordered.  You note does not specify how much to disp or sig.  Please advise. UDS was appropriate

## 2015-02-14 ENCOUNTER — Encounter: Payer: Self-pay | Admitting: Physical Medicine & Rehabilitation

## 2015-02-14 ENCOUNTER — Encounter: Payer: Medicaid Other | Attending: Physical Medicine & Rehabilitation | Admitting: Physical Medicine & Rehabilitation

## 2015-02-14 VITALS — BP 126/72 | HR 88

## 2015-02-14 DIAGNOSIS — F4310 Post-traumatic stress disorder, unspecified: Secondary | ICD-10-CM | POA: Insufficient documentation

## 2015-02-14 DIAGNOSIS — M545 Low back pain: Secondary | ICD-10-CM | POA: Diagnosis not present

## 2015-02-14 DIAGNOSIS — M25561 Pain in right knee: Secondary | ICD-10-CM | POA: Insufficient documentation

## 2015-02-14 DIAGNOSIS — I10 Essential (primary) hypertension: Secondary | ICD-10-CM | POA: Insufficient documentation

## 2015-02-14 DIAGNOSIS — G43909 Migraine, unspecified, not intractable, without status migrainosus: Secondary | ICD-10-CM | POA: Diagnosis not present

## 2015-02-14 DIAGNOSIS — S82141S Displaced bicondylar fracture of right tibia, sequela: Secondary | ICD-10-CM | POA: Diagnosis not present

## 2015-02-14 DIAGNOSIS — F32A Depression, unspecified: Secondary | ICD-10-CM

## 2015-02-14 DIAGNOSIS — F319 Bipolar disorder, unspecified: Secondary | ICD-10-CM | POA: Insufficient documentation

## 2015-02-14 DIAGNOSIS — F5104 Psychophysiologic insomnia: Secondary | ICD-10-CM | POA: Diagnosis not present

## 2015-02-14 DIAGNOSIS — F329 Major depressive disorder, single episode, unspecified: Secondary | ICD-10-CM

## 2015-02-14 DIAGNOSIS — G4700 Insomnia, unspecified: Secondary | ICD-10-CM

## 2015-02-14 DIAGNOSIS — G8929 Other chronic pain: Secondary | ICD-10-CM | POA: Diagnosis not present

## 2015-02-14 DIAGNOSIS — M79604 Pain in right leg: Secondary | ICD-10-CM | POA: Diagnosis present

## 2015-02-14 MED ORDER — CLONAZEPAM 0.5 MG PO TABS: 0.5000 mg | ORAL_TABLET | Freq: Two times a day (BID) | ORAL | Status: DC

## 2015-02-14 MED ORDER — TRAMADOL HCL 50 MG PO TABS: ORAL_TABLET | ORAL | Status: DC

## 2015-02-14 MED ORDER — CITALOPRAM HYDROBROMIDE 20 MG PO TABS: 20.0000 mg | ORAL_TABLET | Freq: Every day | ORAL | Status: DC

## 2015-02-14 NOTE — Patient Instructions (Signed)
PLEASE CALL ME WITH ANY PROBLEMS OR QUESTIONS (#[phone]).  HAVE A GOOD DAY!    

## 2015-02-14 NOTE — Progress Notes (Signed)
Subjective:    Patient ID: Louis [name], male    DOB: Mar 04, 1971, 44 y.o.   MRN: [id number]  HPI   Louis [name] is here in follow up of his low back and knee pain. He started the elavil which has not really helped pain,sleep, or mood but has caused impotence.   He is still having anxiety and depression. The celexa hasn't had a big impact. He isn't experiencing any side effects at this point.  The tramadol seems to help his pain but it only provides partial relief.  He brought with him some files from his orthopedic surgeon---there were also xray reports from the [place] which note mild spondylosis and lumbar spondylolisthesis but they are from 2010.      Pain Inventory Average Pain 7 Pain Right Now 5 My pain is constant, burning, tingling and aching  In the last 24 hours, has pain interfered with the following? General activity 8 Relation with others 4 Enjoyment of life 10 What TIME of day is your pain at its worst? evening Sleep (in general) Poor  Pain is worse with: walking Pain improves with: medication Relief from Meds: 2  Mobility walk without assistance how many minutes can you walk? 10 ability to climb steps?  no do you drive?  yes Do you have any goals in this area?  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  yes  Neuro/Psych weakness depression anxiety suicidal thoughts  Prior Studies Any changes since last visit?  no  Physicians involved in your care Any changes since last visit?  no   Family History  Problem Relation Age of Onset  . Stroke Father    Social History   Social History  . Marital Status: Single    Spouse Name: Louis [name]  . Number of Children: 1  . Years of Education: HS +   Social History Main Topics  . Smoking status: Never Smoker   . Smokeless tobacco: Never Used  . Alcohol Use: No  . Drug Use: No  . Sexual Activity: Not Asked   Other Topics Concern  . None   Social  History Narrative   Patient is single and lives alone.   Patient has one child.   Patient has high school education and some college.   Patient is currently unemployed.   Patient is left-handed.   Patient drinks a 2 liter of soda on most days.         Past Surgical History  Procedure Laterality Date  . Multiple tooth extractions    . Orif tibia fracture Left 04/2014    dr Turner Daniels  . Orif tibia plateau Right 05/10/2014    Procedure: OPEN REDUCTION INTERNAL FIXATION (ORIF)RIGHT PROXIMAL TIBIA FRACTURE;  Surgeon: Louis Lewandowsky, MD;  Location: [facility] OR;  Service: Orthopedics;  Laterality: Right;   Past Medical History  Diagnosis Date  . Back pain   . Headache   . Migraine   . Post traumatic stress disorder   . Spondylosis   . Chronic lower back pain 03/09/2014    For over 20 years- since serving in the [employer].  Lower and cervical pain.   . Pain of right  arm 03/09/2014  . Depression   . Closed bicondylar fracture of proximal end of right tibia   . Hypercholesterolemia   . Hypertension   . Pneumonia     as a baby  . Insomnia   .  Anxiety   . Bipolar disorder   . Diarrhea   . 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   BP 126/72 mmHg  Pulse 88  SpO2 98%  Opioid Risk Score:   Fall Risk Score:  `1  Depression screen PHQ 2/9  Depression screen [facility] 2/9 02/14/2015 01/19/2015  Decreased Interest 3 3  Down, Depressed, Hopeless 3 3  PHQ - 2 Score 6 6  Altered sleeping - 3  Tired, decreased energy - 3  Change in appetite - 1  Feeling bad or failure about yourself  - 3  Trouble concentrating - 3  Moving slowly or fidgety/restless - 1  Suicidal thoughts - 3  PHQ-9 Score - 23  Difficult doing work/chores - Extremely dIfficult      Review of Systems  Gastrointestinal: Positive for nausea and diarrhea.  Neurological: Positive for weakness.  Psychiatric/Behavioral: Positive for suicidal ideas, dysphoric mood and agitation.  All other systems reviewed and are  negative.      Objective:   Physical Exam General: Alert and oriented x 3, No apparent distress. Fairly well kept. Has long beard  HEENT: Head is normocephalic, atraumatic, PERRLA, EOMI, sclera anicteric, oral mucosa pink and moist, dentition intact, ext ear canals clear,  Neck: Supple without JVD or lymphadenopathy  Heart: Reg rate and rhythm. No murmurs rubs or gallops  Chest: CTA bilaterally without wheezes, rales, or rhonchi; no distress  Abdomen: Soft, non-tender, non-distended, bowel sounds positive.  Extremities: No clubbing, cyanosis, or edema. Pulses are 2+  Skin: Clean and intact without signs of breakdown  Neuro: Pt is cognitively appropriate with normal insight, memory, and awareness. Cranial nerves 2-12 are intact. Sensory exam is normal. Reflexes are 2+ in all 4's. Fine motor coordination is intact. No tremors. Motor function is grossly 5/5 except for pain inhibition at the right knee.  Musculoskeletal: fair standing posture. Right hemipelvis slightly elevated over the left. Able to bend to about 60 degrees with mild to moderate pain. Rotation and side bending caused a bit more pain along the left and right lower paraspinals. Extension did not provoke pain. Right knee is notable for large scar across knee joint. He has pain along the medial joint line. Mild crepitus. No obvious effusion. He did not appear to have antalgia with gait. Knee rom appears adequate. He did have some tenderness with palpation over the popliteal fossa. Meniscal maneuvers were equivocal  .  Psych: Pt's affect is flat---a little less anxious today.  Assessment & Plan:   1. Chronic right knee pain after severe tibial plateau fracture and ORIF in November 2015  2. Chronic low back pain  3. PTSD  4. Chronic Insomnia   Plan:  1. Klonopin 0.5mg  q12 for anxiety/sleep/spasm. We discussed the importance of improving his mood/anxiety/sleep as it pertains to his pain. 2. Celexa: increase to 20mg  qhs  3. Tramadol  for breakthrough   4. Xray of lumbar spine to assess disk spaces..   5. 25 minutes of face to face patient care time were spent during this visit. All questions were encouraged and answered. Follow up with me in about a month.

## 2015-02-23 ENCOUNTER — Ambulatory Visit
Admission: RE | Admit: 2015-02-23 | Discharge: 2015-02-23 | Disposition: A | Discharge status: Home / Self Care | Payer: Medicaid Other | Source: Ambulatory Visit | Attending: Physical Medicine & Rehabilitation | Admitting: Physical Medicine & Rehabilitation

## 2015-02-23 ENCOUNTER — Telehealth: Payer: Self-pay | Admitting: Physical Medicine & Rehabilitation

## 2015-02-23 DIAGNOSIS — S82141S Displaced bicondylar fracture of right tibia, sequela: Secondary | ICD-10-CM

## 2015-02-23 DIAGNOSIS — F4310 Post-traumatic stress disorder, unspecified: Secondary | ICD-10-CM

## 2015-02-23 DIAGNOSIS — G8929 Other chronic pain: Secondary | ICD-10-CM

## 2015-02-23 DIAGNOSIS — M545 Low back pain, unspecified: Secondary | ICD-10-CM

## 2015-02-23 DIAGNOSIS — G4700 Insomnia, unspecified: Secondary | ICD-10-CM

## 2015-02-23 NOTE — Telephone Encounter (Signed)
Called [phone]. This number does not have a voicemail set up so I could not leave a message. Please try again on Monday to contact patient.

## 2015-02-23 NOTE — Telephone Encounter (Signed)
Needs to have his prescription Klonopin transferred to [facility] call patient with any questions

## 2015-02-23 NOTE — Telephone Encounter (Signed)
Please let Louis [name] know that his lumbar xray showed some degeneration at L5-S1 (formally anterolisthesis) which is slippage forward. Formal results below.   Can continue with current plan. Consider follow up with MRI at next visit.  IMPRESSION: No acute fracture. Bilateral pars defect at L5 level. About 4 mm anterolisthesis L5 on S1 vertebral body.

## 2015-02-27 NOTE — Telephone Encounter (Signed)
I spoke with Louis [name] and gave him Dr Riley Kill report.  He would like to get a copy of his x ray report and I told him he could come by and request a copy since he does not have computer or myChart.  He asked about his klonopin.  There seems to be an issue with getting it filled at [facility] and I told him I woul dtake care of it.  It utrns out I can not find out what the problem is.  He had a hard copy of his klonopin and took it to [facility] and they could not fill it because he says he has a "lock" with another pharmacy.  They were uncertain of what this was because they were able to fill his tramadol and typically if medicaid has patients under a controlled medication lock then the tramadol would fall under the category. I called [facility] and [facility] and non of these pharmacies could name what he was talking about.  By looking at his NCCSR there doesn't appear to be any klonopin reported which deepens the mystery.  I tried to call Louis Kanaan back but he was not answering and no voicemail.

## 2015-03-07 NOTE — Progress Notes (Signed)
Urine drug screen for this encounter is consistent for prescribed medication 

## 2015-03-28 ENCOUNTER — Encounter: Payer: Self-pay | Admitting: Physical Medicine & Rehabilitation

## 2015-03-28 ENCOUNTER — Encounter: Payer: Medicaid Other | Attending: Physical Medicine & Rehabilitation | Admitting: Physical Medicine & Rehabilitation

## 2015-03-28 VITALS — BP 130/82 | HR 98

## 2015-03-28 DIAGNOSIS — M25561 Pain in right knee: Secondary | ICD-10-CM | POA: Insufficient documentation

## 2015-03-28 DIAGNOSIS — F5104 Psychophysiologic insomnia: Secondary | ICD-10-CM | POA: Diagnosis not present

## 2015-03-28 DIAGNOSIS — I10 Essential (primary) hypertension: Secondary | ICD-10-CM | POA: Insufficient documentation

## 2015-03-28 DIAGNOSIS — F319 Bipolar disorder, unspecified: Secondary | ICD-10-CM | POA: Insufficient documentation

## 2015-03-28 DIAGNOSIS — G43909 Migraine, unspecified, not intractable, without status migrainosus: Secondary | ICD-10-CM | POA: Diagnosis not present

## 2015-03-28 DIAGNOSIS — F329 Major depressive disorder, single episode, unspecified: Secondary | ICD-10-CM

## 2015-03-28 DIAGNOSIS — S82141S Displaced bicondylar fracture of right tibia, sequela: Secondary | ICD-10-CM

## 2015-03-28 DIAGNOSIS — F4310 Post-traumatic stress disorder, unspecified: Secondary | ICD-10-CM | POA: Diagnosis not present

## 2015-03-28 DIAGNOSIS — M47817 Spondylosis without myelopathy or radiculopathy, lumbosacral region: Secondary | ICD-10-CM

## 2015-03-28 DIAGNOSIS — M545 Low back pain: Secondary | ICD-10-CM | POA: Diagnosis not present

## 2015-03-28 DIAGNOSIS — G8929 Other chronic pain: Secondary | ICD-10-CM | POA: Insufficient documentation

## 2015-03-28 DIAGNOSIS — M79604 Pain in right leg: Secondary | ICD-10-CM | POA: Diagnosis present

## 2015-03-28 DIAGNOSIS — G4700 Insomnia, unspecified: Secondary | ICD-10-CM | POA: Diagnosis not present

## 2015-03-28 DIAGNOSIS — F32A Depression, unspecified: Secondary | ICD-10-CM

## 2015-03-28 MED ORDER — TRAMADOL HCL 50 MG PO TABS: ORAL_TABLET | ORAL | Status: DC

## 2015-03-28 MED ORDER — GABAPENTIN 600 MG PO TABS: 600.0000 mg | ORAL_TABLET | Freq: Three times a day (TID) | ORAL | Status: AC

## 2015-03-28 NOTE — Progress Notes (Signed)
Subjective:    Patient ID: Louis [name], male    DOB: 07-Apr-1971, 44 y.o.   MRN: [id number]  HPI   Louis [name] is here in follow up of his chronic pain. He feels that the tramadol and gabapentin are helping his pain. i sent him for an xray of his lumbar spine which revealed 4mm anterolisthesis of L5 on S1. 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 The remainder was normal. He never received the klonopin because he is on a [facility] locked in program and can only have one prescirber/pharmacy for controlled meds  He continues to report pain in his mid low back. It is often very painful at night when he sleeps and bothers when he bends and is more active. His left knee and medial leg are also painful when he is active on the limb.   Pain Inventory Average Pain 7 Pain Right Now 8 My pain is constant and aching  In the last 24 hours, has pain interfered with the following? General activity 7 Relation with others 8 Enjoyment of life 10 What TIME of day is your pain at its worst? morning, evening Sleep (in general) NA  Pain is worse with: walking and bending Pain improves with: heat/ice and medication Relief from Meds: 10  Mobility walk without assistance how many minutes can you walk? 10 ability to climb steps?  no do you drive?  yes transfers alone Do you have any goals in this area?  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  no  Physicians involved in your care Any changes since last visit?  no [facility] [place]   Family History  Problem Relation Age of Onset  . Stroke Father    Social History   Social History  . Marital Status: Single    Spouse Name: N/A  . Number of Children: 1  . Years of Education: HS +   Social History Main Topics  . Smoking status: Never Smoker   . Smokeless  tobacco: Never Used  . Alcohol Use: No  . Drug Use: No  . Sexual Activity: Not Asked   Other Topics Concern  . None   Social History Narrative   Patient is single and lives alone.   Patient has one child.   Patient has high school education and some college.   Patient is currently unemployed.   Patient is left-handed.   Patient drinks a 2 liter of soda on most days.         Past Surgical History  Procedure Laterality Date  . Multiple tooth extractions    . Orif tibia fracture Left 04/2014    dr Turner Daniels  . Orif tibia plateau Right 05/10/2014    Procedure: OPEN REDUCTION INTERNAL FIXATION (ORIF)RIGHT PROXIMAL TIBIA FRACTURE;  Surgeon: Nestor Lewandowsky, MD;  Location: [facility] OR;  Service: Orthopedics;  Laterality: Right;   Past Medical History  Diagnosis Date  . Back pain   . Headache   . Migraine   . Post traumatic stress disorder   . Spondylosis   . Chronic lower back pain 03/09/2014    For over 20 years- since serving in the [employer].  Lower and cervical pain.   . Pain of right  arm 03/09/2014  . Depression   . Closed bicondylar fracture of proximal end of right tibia   .  Hypercholesterolemia   . Hypertension   . Pneumonia     as a baby  . Insomnia   . Anxiety   . Bipolar disorder   . Diarrhea   . 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   BP 130/82 mmHg  Pulse 98  Opioid Risk Score:   Fall Risk Score:  `1  Depression screen PHQ 2/9  Depression screen [facility] 2/9 03/28/2015 02/14/2015 01/19/2015  Decreased Interest Down, Depressed, Hopeless PHQ - 2 Score Altered sleeping - - 3  Tired, decreased energy - - 3  Change in appetite - - 1  Feeling bad or failure about yourself  - - 3  Trouble concentrating - - 3  Moving slowly or fidgety/restless - - 1  Suicidal thoughts - - 3  PHQ-9 Score - - 23  Difficult doing work/chores - - Extremely dIfficult     Review of Systems  Constitutional:       Night sweats  Gastrointestinal: Positive  for nausea and diarrhea.  Skin: Positive for rash.  All other systems reviewed and are negative.      Objective:   Physical Exam  General: Alert and oriented x 3, No apparent distress. Fairly well kept.   HEENT: Head is normocephalic, atraumatic, PERRLA, EOMI, sclera anicteric, oral mucosa pink and moist, dentition intact, ext ear canals clear,  Neck: Supple without JVD or lymphadenopathy  Heart: Reg rate and rhythm. No murmurs rubs or gallops  Chest: CTA bilaterally without wheezes, rales, or rhonchi; no distress  Abdomen: Soft, non-tender, non-distended, bowel sounds positive.  Extremities: No clubbing, cyanosis, or edema. Pulses are 2+  Skin: Clean and intact without signs of breakdown  Neuro: Pt is cognitively appropriate with normal insight, memory, and awareness. Cranial nerves 2-12 are intact. Sensory exam is normal. Reflexes are 2+ in all 4's. Fine motor coordination is intact. No tremors. Motor function is grossly 5/5 except for pain inhibition at the right knee.  Musculoskeletal: fair standing posture. Right hemipelvis remains slightly elevated over the left. Able to bend to about 60 degrees with  moderate pain. Rotation and side bending caused a bit more pain along the left and right lower paraspinals. Extension did not provoke pain again. Right knee is notable for large scar across knee joint. He has pain along the medial joint line and into medial leg---mild effusion at knee also. Mild crepitus. No obvious effusion.   Knee rom appears adequate. He did have some tenderness with palpation over the popliteal fossa. Meniscal maneuvers were equivocal .  Psych: Pt's affect remains somewhat flat---less anxious today.    Assessment & Plan:   1. Chronic right knee pain after severe tibial plateau fracture and ORIF in November 2015 --mild neuropathic component, infrapatellar saphenous nerve? 2. Chronic low back pain. 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 PTSD  4. Chronic  Insomnia    Plan:  1. Klonopin 0.5mg  q12 for anxiety/sleep/spasm. Will contact [facility] regarding his locked in program. I am willing to be his Louis [name]. 2. Celexa: maintain at  qhs --this appears to have helped 3. Tramadol for breakthrough--refilled today  4. MRI of the lumbar spine to assess L5-S1 level in particular.  5. Increased gabapentin to  TID 6.  25 minutes of face to face patient care time were spent during this visit. All questions were encouraged and answered. Follow up with me in about a month.

## 2015-03-28 NOTE — Patient Instructions (Signed)
PLEASE CALL ME WITH ANY PROBLEMS OR QUESTIONS (#[phone]).  HAVE A GOOD DAY!    

## 2015-03-30 ENCOUNTER — Emergency Department (HOSPITAL_COMMUNITY)
Admission: EM | Admit: 2015-03-30 | Discharge: 2015-03-30 | Disposition: A | Discharge status: Home / Self Care | Payer: Medicaid Other | Attending: Emergency Medicine | Admitting: Emergency Medicine

## 2015-03-30 ENCOUNTER — Encounter (HOSPITAL_COMMUNITY): Payer: Self-pay | Admitting: Emergency Medicine

## 2015-03-30 DIAGNOSIS — Z8701 Personal history of pneumonia (recurrent): Secondary | ICD-10-CM | POA: Diagnosis not present

## 2015-03-30 DIAGNOSIS — F319 Bipolar disorder, unspecified: Secondary | ICD-10-CM | POA: Insufficient documentation

## 2015-03-30 DIAGNOSIS — Z8781 Personal history of (healed) traumatic fracture: Secondary | ICD-10-CM | POA: Insufficient documentation

## 2015-03-30 DIAGNOSIS — Z8739 Personal history of other diseases of the musculoskeletal system and connective tissue: Secondary | ICD-10-CM | POA: Insufficient documentation

## 2015-03-30 DIAGNOSIS — F4310 Post-traumatic stress disorder, unspecified: Secondary | ICD-10-CM | POA: Insufficient documentation

## 2015-03-30 DIAGNOSIS — K088 Other specified disorders of teeth and supporting structures: Secondary | ICD-10-CM | POA: Diagnosis present

## 2015-03-30 DIAGNOSIS — G43909 Migraine, unspecified, not intractable, without status migrainosus: Secondary | ICD-10-CM | POA: Insufficient documentation

## 2015-03-30 DIAGNOSIS — G8929 Other chronic pain: Secondary | ICD-10-CM | POA: Diagnosis not present

## 2015-03-30 DIAGNOSIS — Z79899 Other long term (current) drug therapy: Secondary | ICD-10-CM | POA: Diagnosis not present

## 2015-03-30 DIAGNOSIS — K029 Dental caries, unspecified: Secondary | ICD-10-CM | POA: Diagnosis not present

## 2015-03-30 DIAGNOSIS — F418 Other specified anxiety disorders: Secondary | ICD-10-CM | POA: Diagnosis not present

## 2015-03-30 DIAGNOSIS — E780 Pure hypercholesterolemia: Secondary | ICD-10-CM | POA: Diagnosis not present

## 2015-03-30 DIAGNOSIS — I10 Essential (primary) hypertension: Secondary | ICD-10-CM | POA: Diagnosis not present

## 2015-03-30 DIAGNOSIS — G4700 Insomnia, unspecified: Secondary | ICD-10-CM | POA: Diagnosis not present

## 2015-03-30 MED ORDER — BUPIVACAINE-EPINEPHRINE (PF) 0.5% -1:200000 IJ SOLN
1.8000 mL | Freq: Once | INTRAMUSCULAR | Status: AC
  Administered 2015-03-30: 1.8 mL
  Filled 2015-03-30: qty 1.8

## 2015-03-30 MED ORDER — AMOXICILLIN 500 MG PO CAPS: 500.0000 mg | ORAL_CAPSULE | Freq: Three times a day (TID) | ORAL | Status: DC

## 2015-03-30 MED ORDER — HYDROCODONE-ACETAMINOPHEN 5-325 MG PO TABS: ORAL_TABLET | ORAL | Status: DC

## 2015-03-30 MED ORDER — AMOXICILLIN 500 MG PO CAPS
500.0000 mg | ORAL_CAPSULE | Freq: Once | ORAL | Status: AC
  Administered 2015-03-30: 500 mg via ORAL
  Filled 2015-03-30: qty 1

## 2015-03-30 NOTE — ED Provider Notes (Signed)
CSN: [id number]     Arrival date & time 03/30/15  2111 History  By signing my name below, I, Louis Louis, attest that this documentation has been prepared under Louis direction and in Louis presence of [employer], PA-C. Electronically Signed: Placido [name], ED Scribe. 03/30/2015. 9:43 PM.   Chief Complaint  Louis presents with  . Dental Pain   Louis history is provided by Louis Louis. No language interpreter was used.    HPI Comments: Louis Louis is a 44 y.o. male who presents to [facility] complaining of worsening, moderate, gradual onset right upper dental pain with onset 2 days ago. Louis Louis notes that it began as a small discomfort and worsened with associated, mild, swelling, sleep disturbance due to Louis pain and further notes a worsening of his pain with any palpation. Louis Louis notes taking 4x tramadol, 2x gabapentin, 2x 800 mg ibuprofen and 3x 220 mg Advil which have provided momentary relief of his pain. Louis Louis notes having a current dental provider and further notes recently having had some fillings put in place but denies having been notified of any other dental issues. Louis Louis denies any other associated issues.   Past Medical History  Diagnosis Date  . Back pain   . Headache   . Migraine   . Post traumatic stress disorder   . Spondylosis   . Chronic lower back pain 03/09/2014    For over 20 years- since serving in [place] [employer].  Lower and cervical pain.   . Pain of right  arm 03/09/2014  . Depression   . Closed bicondylar fracture of proximal end of right tibia   . Hypercholesterolemia   . Hypertension   . Pneumonia     as a baby  . Insomnia   . Anxiety   . Bipolar disorder   . Diarrhea   . 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 Multiple tooth extractions    . Orif tibia fracture Left 04/2014    dr Turner Daniels  . Orif tibia plateau Right 05/10/2014    Procedure: OPEN REDUCTION INTERNAL FIXATION  (ORIF)RIGHT PROXIMAL TIBIA FRACTURE;  Surgeon: Nestor Lewandowsky, MD;  Location: [facility] OR;  Service: Orthopedics;  Laterality: Right;   Family History  Problem Relation Age of Onset  . Stroke Father    Social History  Substance Use Topics  . Smoking status: Never Smoker   . Smokeless tobacco: Never Used  . Alcohol Use: No    Review of Systems A complete 10 system review of systems was obtained and all systems are negative except as noted in Louis HPI and PMH.   Allergies  Fish allergy; Anthrax vaccine; Iodine; and Other  Home Medications   Prior to Admission medications   Medication Sig Start Date End Date Taking? Authorizing Provider  amitriptyline (ELAVIL) 25 MG tablet Take 1 tablet (25 mg total) by mouth at bedtime. 01/26/15   Ranelle Oyster, MD  citalopram (CELEXA) 20 MG tablet Take 1 tablet (20 mg total) by mouth at bedtime. 02/14/15   Ranelle Oyster, MD  clonazePAM (KLONOPIN) 0.5 MG tablet Take 1 tablet (0.5 mg total) by mouth 2 (two) times daily. 02/14/15   Ranelle Oyster, MD  gabapentin (NEURONTIN) 600 MG tablet Take 1 tablet (600 mg total) by mouth 3 (three) times daily. 03/28/15   Ranelle Oyster, MD  ibuprofen (ADVIL,MOTRIN) 800 MG tablet Take 800 mg by  mouth every 8 (eight) hours. As needed for back pain    Historical Provider, MD  pravastatin (PRAVACHOL) 20 MG tablet Take 20 mg by mouth at bedtime.    Historical Provider, MD  traMADol (ULTRAM) 50 MG tablet 1 tablet every 6-8 hours as needed 03/28/15   Ranelle Oyster, MD   BP 146/92 mmHg  Pulse 73  Temp(Src) 98.3 F (36.8 C) (Oral)  Resp 20  SpO2 97% Physical Exam  Constitutional: Louis Louis is oriented to person, place, and time. Louis Louis appears well-developed and well-nourished. No distress.  HENT:  Head: Normocephalic and atraumatic.  Mouth/Throat: Oropharynx is clear and moist. No oropharyngeal exudate.  Generally poor dentition, no gingival swelling, erythema or tenderness to palpation. Louis is handling their secretions.  There is no tenderness to palpation or firmness underneath tongue bilaterally. No trismus.    Eyes: Conjunctivae and EOM are normal. Pupils are equal, round, and reactive to light.  Neck: Normal range of motion. No tracheal deviation present.  Cardiovascular: Normal rate.   Pulmonary/Chest: Effort normal. No stridor. No respiratory distress.  Abdominal: Soft. There is no tenderness.  Musculoskeletal: Normal range of motion.  Lymphadenopathy:    Louis Louis has no cervical adenopathy.  Neurological: Louis Louis is alert and oriented to person, place, and time.  Skin: Skin is warm and dry. Louis Louis is not diaphoretic.  Psychiatric: Louis Louis has a normal mood and affect. His behavior is normal.  Nursing note and vitals reviewed.  ED Course  NERVE BLOCK Date/Time: 03/30/2015 9:55 PM Performed by: Wynetta Emery Authorized by: Wynetta Emery Consent: Verbal consent obtained. Consent given by: Louis Louis identity confirmed: verbally with Louis Indications: pain relief Body area: face/mouth Nerve: posterior superior alveolar Laterality: right Louis sedated: no Louis position: sitting Needle gauge: 27 G Location technique: anatomical landmarks Local anesthetic: bupivacaine 0.5% with epinephrine Anesthetic total: 1.8 ml Outcome: pain improved Louis tolerance: Louis tolerated Louis procedure well with no immediate complications    DIAGNOSTIC STUDIES: Oxygen Saturation is 97% on RA, normal by my interpretation.    COORDINATION OF CARE: 9:41 PM Discussed treatment plan with Louis Louis at bedside including a dental block and Louis Louis agreed to plan.  Labs Review Labs Reviewed - No data to display  Imaging Review No results found. I have personally reviewed and evaluated these images and lab results as part of my medical decision-making.   EKG Interpretation None      MDM   Final diagnoses:  Pain due to dental caries    Filed Vitals:   03/30/15 2120  BP: 146/92  Pulse: 73  Temp: 98.3 F  (36.8 C)  TempSrc: Oral  Resp: 20  SpO2: 97%    Medications  bupivacaine-epinephrine (MARCAINE W/ EPI) 0.5% -1:200000 injection 1.8 mL (not administered)  amoxicillin (AMOXIL) capsule 500 mg (not administered)    [name] is a pleasant 44 y.o. male presenting with dental pain associated with dental caries but no signs or symptoms of dental abscess. Louis afebrile, non toxic appearing and swallowing secretions well. I gave Louis referral to dentist and stressed Louis importance of dental follow up for definitive management of dental issues. Louis voices understanding and is agreeable to plan.  Evaluation does not show pathology that would require ongoing emergent intervention or inpatient treatment. Louis Louis is hemodynamically stable and mentating appropriately. Discussed findings and plan with Louis/guardian, who agrees with care plan. All questions answered. Return precautions discussed and outpatient follow up given.   New Prescriptions   AMOXICILLIN (AMOXIL) 500 MG CAPSULE  Take 1 capsule (500 mg total) by mouth 3 (three) times daily.   HYDROCODONE-ACETAMINOPHEN (NORCO/VICODIN) 5-325 MG TABLET    Take 1-2 tablets by mouth every 6 hours as needed for pain and/or cough.     I personally performed Louis services described in this documentation, which was scribed in my presence. Louis recorded information has been reviewed and is accurate.    Wynetta Emery, PA-C 03/30/15 2156  Tilden Fossa, MD 03/31/15 [phone]

## 2015-03-30 NOTE — Discharge Instructions (Signed)
Take vicodin for breakthrough pain, do not drink alcohol, drive, care for children or do other critical tasks while taking vicodin. ° °Return to the emergency room for fever, change in vision, redness to the face that rapidly spreads towards the eye, nausea or vomiting, difficulty swallowing or shortness of breath. °  °Apply warm compresses to jaw throughout the day.  ° °Take your antibiotics as directed and to the end of the course.  ° °Followup with a dentist is very important for ongoing evaluation and management of recurrent dental pain. Return to emergency department for emergent changing or worsening symptoms." ° °Low-cost dental clinic: °**David  Civils  at [phone]**  °**Janna Civils at [phone] [address]**   ° °You may also call [phone] ° °Dental Assistance °If the dentist on-call cannot see you, please use the resources below: ° ° °Patients with Medicaid: [facility] °[address], [phone] °[address], [phone] ° °If unable to pay, or uninsured, contact [facility] ([phone]) or [facility] ([phone] in [place], [phone] in [place]) to become qualified for the adult dental clinic ° °Other Low-Cost Community Dental Services: °[facility]- [address], [place], [place], [address] °   [phone], Ext. [phone] °   2nd and 4th Thursday of the month at 6:30am °   10 clients each day by appointment, can sometimes see walk-in     patients if someone does not show for an appointment °[facility]- [address], [place], [place], [address] °   [phone] °[facility]- [address], [place], [place], [address] °   [phone] ° °[facility]- [phone] °[facility]- [phone] °[facility]- [phone] ° °

## 2015-03-30 NOTE — ED Notes (Signed)
Pt. reports persistent right upper dental pain onset this week .

## 2015-03-31 DIAGNOSIS — F319 Bipolar disorder, unspecified: Secondary | ICD-10-CM | POA: Insufficient documentation

## 2015-03-31 DIAGNOSIS — Z8701 Personal history of pneumonia (recurrent): Secondary | ICD-10-CM | POA: Insufficient documentation

## 2015-03-31 DIAGNOSIS — I10 Essential (primary) hypertension: Secondary | ICD-10-CM | POA: Insufficient documentation

## 2015-03-31 DIAGNOSIS — Z79899 Other long term (current) drug therapy: Secondary | ICD-10-CM | POA: Diagnosis not present

## 2015-03-31 DIAGNOSIS — G43909 Migraine, unspecified, not intractable, without status migrainosus: Secondary | ICD-10-CM | POA: Insufficient documentation

## 2015-03-31 DIAGNOSIS — G8929 Other chronic pain: Secondary | ICD-10-CM | POA: Insufficient documentation

## 2015-03-31 DIAGNOSIS — K0889 Other specified disorders of teeth and supporting structures: Secondary | ICD-10-CM | POA: Insufficient documentation

## 2015-03-31 NOTE — ED Notes (Signed)
Pt was here last night and got a Rx for vicodin and can't get it filled because of medicaid. Has a letter to use a specific pharmacy and they don't fill narcotics. Will fill tramadol.

## 2015-04-01 ENCOUNTER — Encounter (HOSPITAL_COMMUNITY): Payer: Self-pay

## 2015-04-01 ENCOUNTER — Emergency Department (HOSPITAL_COMMUNITY)
Admission: EM | Admit: 2015-04-01 | Discharge: 2015-04-01 | Disposition: A | Discharge status: Home / Self Care | Payer: Medicaid Other | Attending: Emergency Medicine | Admitting: Emergency Medicine

## 2015-04-01 DIAGNOSIS — K0889 Other specified disorders of teeth and supporting structures: Secondary | ICD-10-CM

## 2015-04-01 MED ORDER — KETOROLAC TROMETHAMINE 60 MG/2ML IM SOLN
60.0000 mg | Freq: Once | INTRAMUSCULAR | Status: AC
Start: 2015-04-01 — End: 2015-04-01
  Administered 2015-04-01: 60 mg via INTRAMUSCULAR
  Filled 2015-04-01: qty 2

## 2015-04-01 NOTE — Discharge Instructions (Signed)

## 2015-04-01 NOTE — ED Notes (Signed)
Pt states that he was seen here last night for the same and was sent home with a prescription for Vicodin. Pt is back this evening, stating that he is not able to get his prescription filled because his medicaid account has been "locked", and can only go to a specific pharmacy who states that they cannot fill narcotics.

## 2015-04-01 NOTE — ED Provider Notes (Signed)
CSN: [id number]     Arrival date & time 03/31/15  2339 History   First MD Initiated Contact with Patient 04/01/15 0003     Chief Complaint  Patient presents with  . Dental Pain     (Consider location/radiation/quality/duration/timing/severity/associated sxs/prior Treatment) Patient is a 44 y.o. male presenting with tooth pain. The history is provided by the patient. No language interpreter was used.  Dental Pain Location:  Upper Upper teeth location:  3/RU 1st molar Quality:  Aching Severity:  Moderate Onset quality:  Gradual Timing:  Constant Progression:  Worsening Chronicity:  New Context: poor dentition   Relieved by:  Nothing Ineffective treatments:  None tried Pt reports he can not get rx for pain medication filled.  (Pharmacy's will not fill due to [place] program.) Pt has medicaid management of pain medications.  Pt is followed by Dr. Riley Kill.   Past Medical History  Diagnosis Date  . Back pain   . Headache   . Migraine   . Post traumatic stress disorder   . Spondylosis   . Chronic lower back pain 03/09/2014    For over 20 years- since serving in the [employer].  Lower and cervical pain.   . Pain of right  arm 03/09/2014  . Depression   . Closed bicondylar fracture of proximal end of right tibia   . Hypercholesterolemia   . Hypertension   . Pneumonia     as a baby  . Insomnia   . Anxiety   . Bipolar disorder (HCC)   . Diarrhea   . 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 Multiple tooth extractions    . Orif tibia fracture Left 04/2014    dr Turner Daniels  . Orif tibia plateau Right 05/10/2014    Procedure: OPEN REDUCTION INTERNAL FIXATION (ORIF)RIGHT PROXIMAL TIBIA FRACTURE;  Surgeon: Nestor Lewandowsky, MD;  Location: [facility] OR;  Service: Orthopedics;  Laterality: Right;   Family History  Problem Relation Age of Onset  . Stroke Father    Social History  Substance Use Topics  . Smoking status: Never  Smoker   . Smokeless tobacco: Never Used  . Alcohol Use: No    Review of Systems  All other systems reviewed and are negative.     Allergies  Fish allergy; Anthrax vaccine; Iodine; and Other  Home Medications   Prior to Admission medications   Medication Sig Start Date End Date Taking? Authorizing Provider  amitriptyline (ELAVIL) 25 MG tablet Take 1 tablet (25 mg total) by mouth at bedtime. 01/26/15   Ranelle Oyster, MD  amoxicillin (AMOXIL) 500 MG capsule Take 1 capsule (500 mg total) by mouth 3 (three) times daily. 03/30/15   Nicole Pisciotta, PA-C  citalopram (CELEXA) 20 MG tablet Take 1 tablet (20 mg total) by mouth at bedtime. 02/14/15   Ranelle Oyster, MD  clonazePAM (KLONOPIN) 0.5 MG tablet Take 1 tablet (0.5 mg total) by mouth 2 (two) times daily. 02/14/15   Ranelle Oyster, MD  gabapentin (NEURONTIN) 600 MG tablet Take 1 tablet (600 mg total) by mouth 3 (three) times daily. 03/28/15   Ranelle Oyster, MD  HYDROcodone-acetaminophen (NORCO/VICODIN) 5-325 MG tablet Take 1-2 tablets by mouth every 6 hours as needed for pain and/or cough. 03/30/15   Nicole Pisciotta, PA-C  ibuprofen (ADVIL,MOTRIN) 800 MG tablet Take 800 mg by mouth every 8 (eight) hours. As needed for back pain  Historical Provider, MD  pravastatin (PRAVACHOL) 20 MG tablet Take 20 mg by mouth at bedtime.    Historical Provider, MD  traMADol (ULTRAM) 50 MG tablet 1 tablet every 6-8 hours as needed 03/28/15   Ranelle Oyster, MD   BP 135/92 mmHg  Pulse 71  Temp(Src) 98.9 F (37.2 C) (Oral)  Resp 16  Wt 190 lb 4.8 oz (86.32 kg)  SpO2 98% Physical Exam  Constitutional: He is oriented to person, place, and time. He appears well-developed and well-nourished.  HENT:  Head: Normocephalic.  Swelling right gum, swelling right side of face  Eyes: EOM are normal.  Neck: Normal range of motion.  Pulmonary/Chest: Effort normal.  Abdominal: He exhibits no distension.  Musculoskeletal: Normal range of motion.   Neurological: He is alert and oriented to person, place, and time.  Psychiatric: He has a normal mood and affect.  Nursing note and vitals reviewed.   ED Course  Procedures (including critical care time) Labs Review Labs Reviewed - No data to display  Imaging Review No results found. I have personally reviewed and evaluated these images and lab results as part of my medical decision-making.   EKG Interpretation None      MDM  Pharmacy reports pt has a contract that he has to use assigned pharmacy.   Pt given torodol Im    Final diagnoses:  Tooth ache        Elson Areas, PA-C 04/01/15 1610  Geoffery Lyons, MD 04/01/15 [phone]

## 2015-04-05 ENCOUNTER — Telehealth: Payer: Self-pay | Admitting: *Deleted

## 2015-04-05 NOTE — Telephone Encounter (Signed)
Louis Brow called to complain that he brought in a prescription for lortab that the dentist gave him for tooth pain that he could not fill because of a medicaid lock.  He had been to the ED x 2 with pain.  Dr Riley Kill said for him to take his tramadol, he was not going to prescribe the lortab.  After relating the story that he was told to take the tramadol and it doesn't work, he ended the call saying "you can tell Dr Riley Kill he can take the tramadol and shove it up his ass".  I played the message for Dr Riley Kill and he has decided to discharge Louis [name] for abusive language towards physician.  A letter has been mailed.

## 2015-04-22 ENCOUNTER — Ambulatory Visit
Admission: RE | Admit: 2015-04-22 | Discharge: 2015-04-22 | Disposition: A | Discharge status: Home / Self Care | Payer: Medicaid Other | Source: Ambulatory Visit | Attending: Physical Medicine & Rehabilitation | Admitting: Physical Medicine & Rehabilitation

## 2015-04-22 DIAGNOSIS — M545 Low back pain, unspecified: Secondary | ICD-10-CM

## 2015-04-22 DIAGNOSIS — G8929 Other chronic pain: Secondary | ICD-10-CM

## 2015-04-30 ENCOUNTER — Ambulatory Visit: Payer: Medicaid Other | Admitting: Physical Medicine & Rehabilitation

## 2015-05-03 ENCOUNTER — Telehealth: Payer: Self-pay | Admitting: Physical Medicine & Rehabilitation

## 2015-05-03 NOTE — Telephone Encounter (Signed)
Even though he is no longer a patient here, please forward him the results of his MRI. The findings are not overly impressive.   thanks

## 2015-05-04 NOTE — Telephone Encounter (Signed)
Louis [name] can you send Louis [name] a copy of his MRI results. Please and thank you
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
## 2016-07-09 IMAGING — RF DG KNEE 1-2V*R*
1 series · 2 of 2 positions shown · IV contrast (agent unspecified)
Comparison: 05/08/2014

CLINICAL DATA: ORIF right proximal tibial condylar fracture

EXAM:
DG C-ARM 61-120 MIN; RIGHT KNEE - 1-2 VIEW
TECHNIQUE: Two intraoperative views of the right proximal tibia
CONTRAST:  None
FLUOROSCOPY TIME:  6 seconds

[Series 1: run · 2 of 2 slices shown]
[im 1/2]
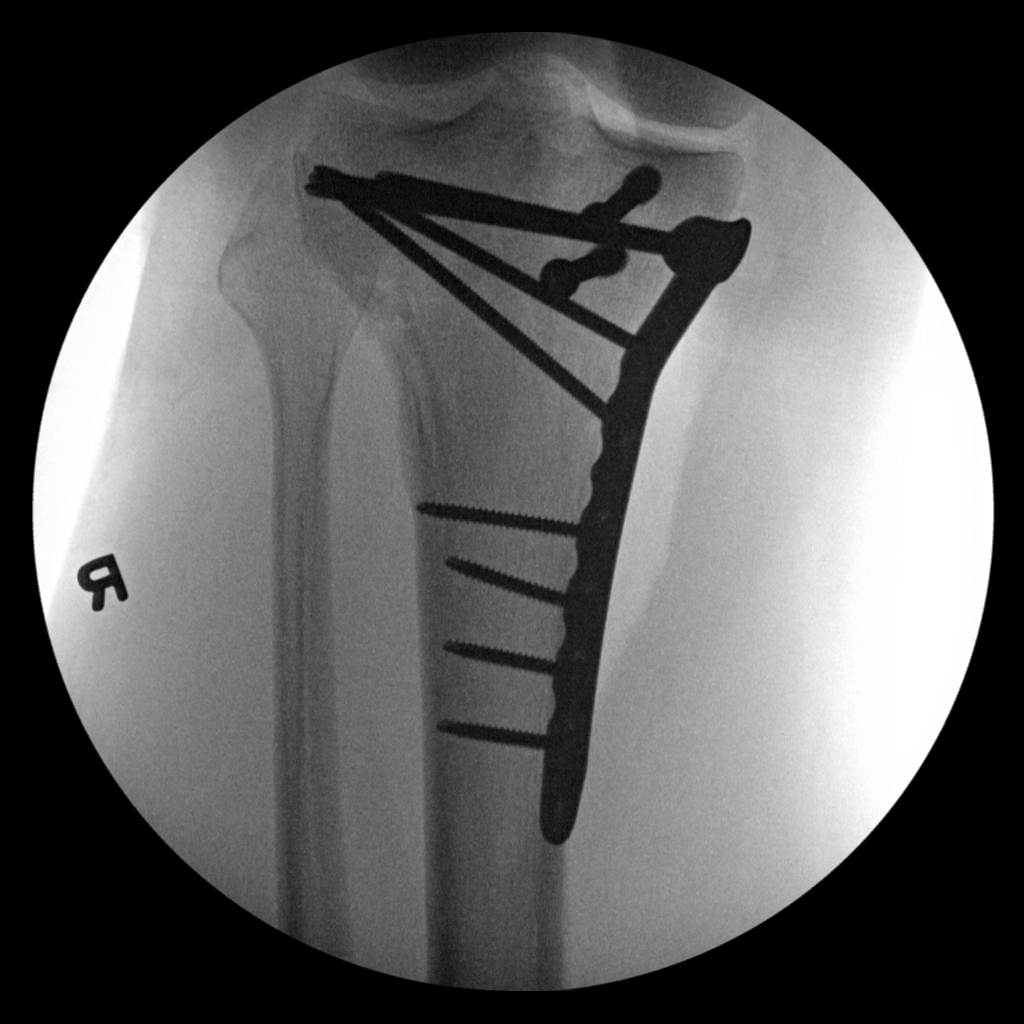
[im 2/2]
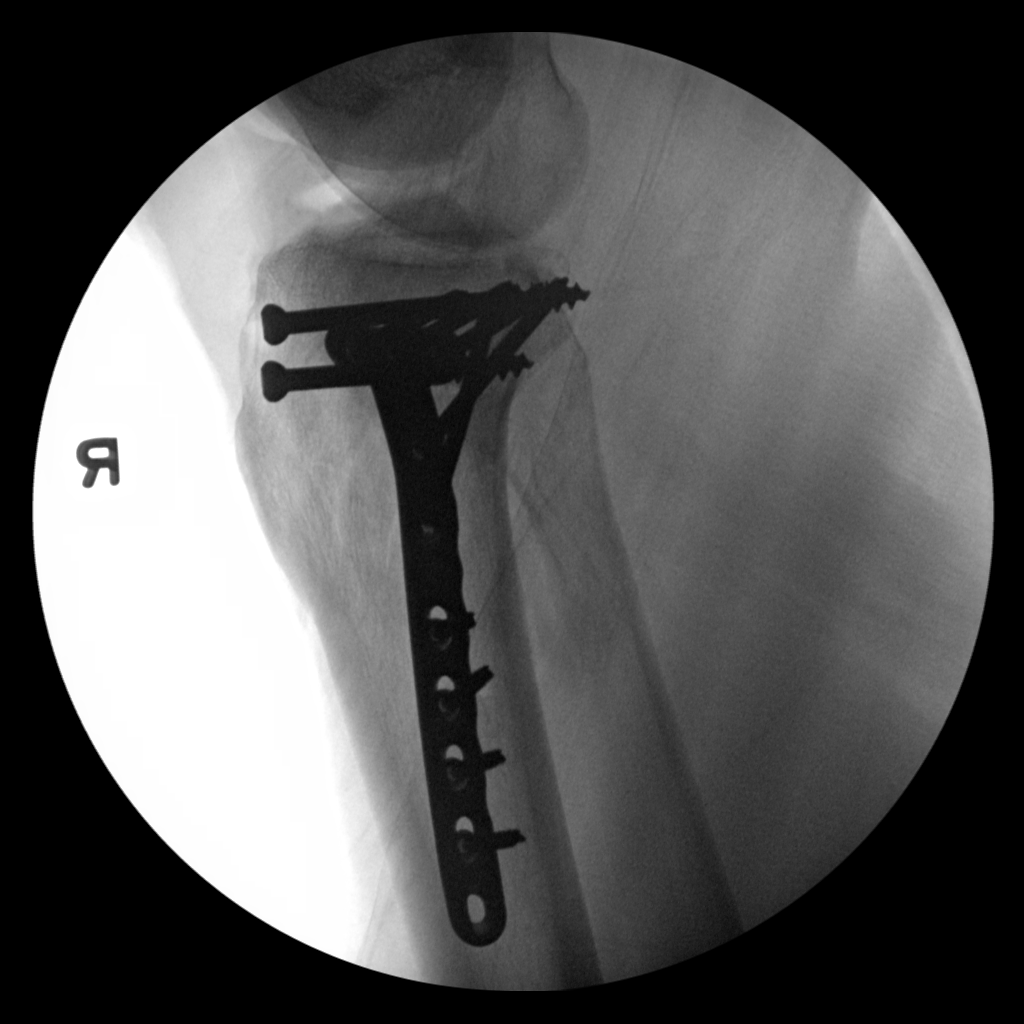

[2 of 2 positions shown; findings below may reference images not displayed]

FINDINGS: Two views of proximal tibia and fibula submitted. The patient is
status post intraoperative repair of comminuted fracture of tibial
plateau. Two metallic fixation screws are noted in proximal tibial.
There is a medial fixation plate and screws in proximal tibia. There
is improvement of alignment with near anatomic position.
IMPRESSION: Status post intraoperative repair of tibial plateau fracture with
metallic fixation material in anatomic alignment.

## 2018-01-24 ENCOUNTER — Emergency Department (HOSPITAL_COMMUNITY): Payer: Medicaid Other

## 2018-01-24 ENCOUNTER — Encounter (HOSPITAL_COMMUNITY): Payer: Self-pay

## 2018-01-24 ENCOUNTER — Emergency Department (HOSPITAL_COMMUNITY)
Admission: EM | Admit: 2018-01-24 | Discharge: 2018-01-25 | Disposition: A | Discharge status: Home / Self Care | Payer: Medicaid Other | Attending: Emergency Medicine | Admitting: Emergency Medicine

## 2018-01-24 DIAGNOSIS — Z79899 Other long term (current) drug therapy: Secondary | ICD-10-CM | POA: Diagnosis not present

## 2018-01-24 DIAGNOSIS — I10 Essential (primary) hypertension: Secondary | ICD-10-CM | POA: Insufficient documentation

## 2018-01-24 DIAGNOSIS — M79644 Pain in right finger(s): Secondary | ICD-10-CM | POA: Insufficient documentation

## 2018-01-24 DIAGNOSIS — Z7902 Long term (current) use of antithrombotics/antiplatelets: Secondary | ICD-10-CM | POA: Diagnosis not present

## 2018-01-24 DIAGNOSIS — M79641 Pain in right hand: Secondary | ICD-10-CM | POA: Diagnosis present

## 2018-01-24 NOTE — ED Triage Notes (Signed)
Pt states that he hurt his R thumb the other day but he dont known how, states he thinks something is in it.

## 2018-01-25 NOTE — ED Provider Notes (Signed)
[facility] EMERGENCY DEPARTMENT Provider Note   CSN: [id number] Arrival date & time: 01/24/18  2232     History   Chief Complaint Chief Complaint  Patient presents with  . Hand Pain    HPI Louis [name] is a 47 y.o. male with history of anxiety, back pain, bipolar disorder, chronic low back pain, hypertension, hypercholesterolemia, migraines, and PTSD presents today for evaluation of acute onset, intermittent right IP joint pain for 3 days.  He notes intermittent "excruciating "pain to the radial aspect of the right IP joint which occurs on palpation and when he attempts to hold anything..  Pain does not radiate.  He states he feels as though may be something stuck in his finger.  He denies any known trauma but works with his hands on a daily basis he has tried his home pain medicine and ibuprofen without significant relief of his symptoms.  Denies any erythema, fevers, numbness, weakness.    The history is provided by the patient.    Past Medical History:  Diagnosis Date  . Anxiety   . Back pain   . Bipolar disorder (HCC)   . Chronic lower back pain 03/09/2014   For over 20 years- since serving in the [employer].  Lower and cervical pain.   . Closed bicondylar fracture of proximal end of right tibia   . Depression   . Diarrhea   . Headache   . Hypercholesterolemia   . Hypertension   . Insomnia   . Memory loss    due to attempted suicide by hanging  . Migraine   . Pain of right  arm 03/09/2014  . Pneumonia    as a baby  . Post traumatic stress disorder   . Spondylosis     Patient Active Problem List   Diagnosis Date Noted  . PTSD (post-traumatic stress disorder) 01/19/2015  . Insomnia 01/19/2015  . Depression 01/19/2015  . Tibial plateau fracture, right 05/10/2014  . Chronic lower back pain 03/09/2014  . Pain of right  arm 03/09/2014    Past Surgical History:  Procedure Laterality Date  . MULTIPLE TOOTH EXTRACTIONS    . ORIF TIBIA FRACTURE  Left 04/2014   dr Turner Daniels  . ORIF TIBIA PLATEAU Right 05/10/2014   Procedure: OPEN REDUCTION INTERNAL FIXATION (ORIF)RIGHT PROXIMAL TIBIA FRACTURE;  Surgeon: Nestor Lewandowsky, MD;  Location: [facility] OR;  Service: Orthopedics;  Laterality: Right;        Home Medications    Prior to Admission medications   Medication Sig Start Date End Date Taking? Authorizing Provider  amitriptyline (ELAVIL) 25 MG tablet Take 1 tablet (25 mg total) by mouth at bedtime. 01/26/15   Ranelle Oyster, MD  amoxicillin (AMOXIL) 500 MG capsule Take 1 capsule (500 mg total) by mouth 3 (three) times daily. 03/30/15   Pisciotta, Joni Reining, PA-C  citalopram (CELEXA) 20 MG tablet Take 1 tablet (20 mg total) by mouth at bedtime. 02/14/15   Ranelle Oyster, MD  clonazePAM (KLONOPIN) 0.5 MG tablet Take 1 tablet (0.5 mg total) by mouth 2 (two) times daily. 02/14/15   Ranelle Oyster, MD  gabapentin (NEURONTIN) 600 MG tablet Take 1 tablet (600 mg total) by mouth 3 (three) times daily. 03/28/15   Ranelle Oyster, MD  HYDROcodone-acetaminophen (NORCO/VICODIN) 5-325 MG tablet Take 1-2 tablets by mouth every 6 hours as needed for pain and/or cough. 03/30/15   Pisciotta, Joni Reining, PA-C  ibuprofen (ADVIL,MOTRIN) 800 MG tablet Take 800 mg by mouth every  8 (eight) hours. As needed for back pain    [provider]  pravastatin (PRAVACHOL) 20 MG tablet Take 20 mg by mouth at bedtime.    [provider]  traMADol Janean Sark) 50 MG tablet 1 tablet every 6-8 hours as needed 03/28/15   Ranelle Oyster, MD    Family History Family History  Problem Relation Age of Onset  . Stroke Father     Social History Social History   Tobacco Use  . Smoking status: Never Smoker  . Smokeless tobacco: Never Used  Substance Use Topics  . Alcohol use: No  . Drug use: No     Allergies   Fish allergy; Anthrax vaccine; Iodine; and Other   Review of Systems Review of Systems  Constitutional: Negative for chills and fever.    Musculoskeletal: Positive for arthralgias.  Skin: Negative for color change.  Neurological: Negative for weakness and numbness.     Physical Exam Updated Vital Signs BP 125/78   Pulse 80   Temp 98.7 F (37.1 C) (Oral)   Resp 16   SpO2 96%   Physical Exam  Constitutional: He appears well-developed and well-nourished. No distress.  HENT:  Head: Normocephalic and atraumatic.  Eyes: Conjunctivae are normal. Right eye exhibits no discharge. Left eye exhibits no discharge.  Neck: No JVD present. No tracheal deviation present.  Cardiovascular: Normal rate and intact distal pulses.  2+ radial pulses bilaterally  Pulmonary/Chest: Effort normal.  Abdominal: He exhibits no distension.  Musculoskeletal: He exhibits no edema.  Is focal tenderness to palpation of the radial aspect of the right IP joint with no overlying erythema, induration, or swelling.  There is no underlying crepitus noted.  No deformity.  Normal range of motion of the wrist and digits.  5/5 strength of wrist and digits with flexion and extension against resistance.  Good grip strength bilaterally.  No foreign bodies palpated.  Neurological: He is alert.  Fluent speech no evidence of dysarthria or aphasia, no facial droop, sensation intact to soft touch of bilateral hands.  Skin: Skin is warm and dry. No erythema.  Psychiatric: He has a normal mood and affect. His behavior is normal.  Nursing note and vitals reviewed.    ED Treatments / Results  Labs (all labs ordered are listed, but only abnormal results are displayed) Labs Reviewed - No data to display  EKG None  Radiology Dg Hand Complete Right  Result Date: 01/24/2018 CLINICAL DATA:  Pain.  Patient reports right thumb pain. EXAM: RIGHT HAND - COMPLETE 3+ VIEW COMPARISON:  None. FINDINGS: There is no evidence of fracture or dislocation. Minimal degenerative cystic change in the scaphoid and ulna styloid. Soft tissues are unremarkable. No soft tissue air or  radiopaque foreign body. IMPRESSION: No acute findings or explanation for thumb pain. Electronically Signed   By: Rubye Oaks M.D.   On: 01/24/2018 23:05    Procedures Procedures (including critical care time) EMERGENCY DEPARTMENT US SOFT TISSUE INTERPRETATION "Study: Limited Soft Tissue Ultrasound"  INDICATIONS: Pain Multiple views of the body part were obtained in real-time with a multi-frequency linear probe  PERFORMED BY: Myself IMAGES ARCHIVED?: Yes SIDE:Right  BODY PART:thumb INTERPRETATION:  No abcess noted, No cellulitis noted and no foreign bodies noted    Medications Ordered in ED Medications - No data to display   Initial Impression / Assessment and Plan / ED Course  I have reviewed the triage vital signs and the nursing notes.  Pertinent labs & imaging results that were  available during my care of the patient were reviewed by me and considered in my medical decision making (see chart for details).     Patient with right thumb pain focal overlying the IP joint.  He is afebrile, vital signs are stable.  He is nontoxic in appearance.  He is neurovascularly intact.  Radiographs show no acute finding of fracture, dislocation, or foreign body.  He does have some degenerative changes noted on x-ray.  Suspect osteoarthritis.  Doubt septic joint in the absence of risk factors, constitutional symptoms, and with normal range of motion of the joint.  Doubt osteomyelitis or gout.  Bedside ultrasound does not show any evidence of foreign body, cellulitis, or abscess.  RICE therapy indicated and discussed with patient.  He takes pain medicine chronically for his back pain.  Recommend follow-up with his PCP or orthopedics for reevaluation of symptoms.  Discussed strict ED return precautions. Pt verbalized understanding of and agreement with plan and is safe for discharge home at this time.  He has no complaints prior to discharge. Final Clinical Impressions(s) / ED Diagnoses   Final  diagnoses:  Thumb pain, right    ED Discharge Orders    None       Jeanie SewerFawze, Shacarra Choe A, PA-C 01/25/18 0100    Dione BoozeGlick, David, MD 01/25/18 [phone]

## 2018-01-25 NOTE — Discharge Instructions (Signed)
Continue to take home medications as prescribed.  Alternate ibuprofen and Tylenol as needed for pain.  Apply ice for comfort.  You may find it helpful to wear a splint that keeps your thumb immobilized.  Follow-up with your primary care physician or an orthopedist for reevaluation of your symptoms.  Return to the emergency department immediately for any worsening signs or symptoms such as swelling, redness, abnormal drainage, fevers, or weakness.

## 2020-05-17 ENCOUNTER — Ambulatory Visit (INDEPENDENT_AMBULATORY_CARE_PROVIDER_SITE_OTHER): Payer: Medicaid Other | Admitting: Psychiatry

## 2020-05-17 ENCOUNTER — Encounter (HOSPITAL_COMMUNITY): Payer: Self-pay | Admitting: Psychiatry

## 2020-05-17 VITALS — BP 135/103 | HR 83 | Ht 68.0 in | Wt 217.0 lb

## 2020-05-17 DIAGNOSIS — F4310 Post-traumatic stress disorder, unspecified: Secondary | ICD-10-CM | POA: Diagnosis not present

## 2020-05-17 DIAGNOSIS — F332 Major depressive disorder, recurrent severe without psychotic features: Secondary | ICD-10-CM

## 2020-05-17 DIAGNOSIS — F3181 Bipolar II disorder: Secondary | ICD-10-CM

## 2020-05-17 DIAGNOSIS — F411 Generalized anxiety disorder: Secondary | ICD-10-CM | POA: Diagnosis not present

## 2020-05-17 DIAGNOSIS — G4700 Insomnia, unspecified: Secondary | ICD-10-CM | POA: Diagnosis not present

## 2020-05-17 MED ORDER — ZOLPIDEM TARTRATE ER 6.25 MG PO TBCR: 6.2500 mg | EXTENDED_RELEASE_TABLET | Freq: Every evening | ORAL | 0 refills | Status: DC | PRN

## 2020-05-17 MED ORDER — LAMOTRIGINE 200 MG PO TABS: 200.0000 mg | ORAL_TABLET | Freq: Every day | ORAL | 2 refills | Status: DC

## 2020-05-17 MED ORDER — HYDROXYZINE HCL 25 MG PO TABS: 25.0000 mg | ORAL_TABLET | Freq: Three times a day (TID) | ORAL | 2 refills | Status: DC | PRN

## 2020-05-17 MED ORDER — MIRTAZAPINE 45 MG PO TABS: 45.0000 mg | ORAL_TABLET | Freq: Every day | ORAL | 2 refills | Status: DC
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
## 2020-06-04 ENCOUNTER — Telehealth (HOSPITAL_COMMUNITY): Payer: Self-pay | Admitting: *Deleted

## 2020-06-04 NOTE — Telephone Encounter (Signed)
Patient called to say he had received information from his insurance co he was denied for Ambien. Researched his prior Berkley Harvey and it looks like Clinical research associate didn't follow thru on it. Called Medicaid but he has [employer] now as his medicaid. Called him to get the number to call and hx of any failed meds for sleep as we dont have much hx on him, one time patient. He has failed Trazodone, Seroquel and Remeron. Called pa in and now waiting for a determination.

## 2020-06-05 ENCOUNTER — Telehealth (HOSPITAL_COMMUNITY): Payer: Self-pay | Admitting: *Deleted

## 2020-06-05 NOTE — Telephone Encounter (Signed)
Prior authorization done and denied per [place] [employer] stating it did not meet policy requirements: not tried and failed Zolpidem tablet, temazepam cap 15 or 30 and Flurazepam cap. Can his Rx be changed to the tablet? He reported to me when we spoke re this prior auth he has failed Trazodone, Seroquel and Remeron.

## 2020-06-13 ENCOUNTER — Other Ambulatory Visit (HOSPITAL_COMMUNITY): Payer: Self-pay | Admitting: Psychiatry

## 2020-06-13 DIAGNOSIS — F5105 Insomnia due to other mental disorder: Secondary | ICD-10-CM

## 2020-06-13 MED ORDER — ZOLPIDEM TARTRATE 5 MG PO TABS: 5.0000 mg | ORAL_TABLET | Freq: Every evening | ORAL | 0 refills | Status: DC | PRN

## 2020-06-13 NOTE — Telephone Encounter (Signed)
Ambien 5 mg tablet ordered and sent to preferred pharmacy.

## 2020-07-26 ENCOUNTER — Other Ambulatory Visit: Payer: Self-pay

## 2020-07-26 ENCOUNTER — Ambulatory Visit (INDEPENDENT_AMBULATORY_CARE_PROVIDER_SITE_OTHER): Payer: Medicaid Other | Admitting: Licensed Clinical Social Worker

## 2020-07-26 DIAGNOSIS — F411 Generalized anxiety disorder: Secondary | ICD-10-CM

## 2020-07-26 DIAGNOSIS — F332 Major depressive disorder, recurrent severe without psychotic features: Secondary | ICD-10-CM

## 2020-07-26 DIAGNOSIS — F4310 Post-traumatic stress disorder, unspecified: Secondary | ICD-10-CM
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
## 2020-08-05 ENCOUNTER — Other Ambulatory Visit (HOSPITAL_COMMUNITY): Payer: Self-pay | Admitting: Psychiatry

## 2020-08-05 DIAGNOSIS — F332 Major depressive disorder, recurrent severe without psychotic features: Secondary | ICD-10-CM

## 2020-08-05 DIAGNOSIS — F411 Generalized anxiety disorder: Secondary | ICD-10-CM

## 2020-08-05 DIAGNOSIS — G4700 Insomnia, unspecified: Secondary | ICD-10-CM

## 2020-08-09 ENCOUNTER — Encounter (HOSPITAL_COMMUNITY): Payer: Self-pay | Admitting: Psychiatry

## 2020-08-09 ENCOUNTER — Ambulatory Visit (INDEPENDENT_AMBULATORY_CARE_PROVIDER_SITE_OTHER): Payer: Medicaid Other | Admitting: Psychiatry

## 2020-08-09 ENCOUNTER — Other Ambulatory Visit: Payer: Self-pay

## 2020-08-09 DIAGNOSIS — F5105 Insomnia due to other mental disorder: Secondary | ICD-10-CM

## 2020-08-09 DIAGNOSIS — F3181 Bipolar II disorder: Secondary | ICD-10-CM | POA: Diagnosis not present

## 2020-08-09 DIAGNOSIS — F332 Major depressive disorder, recurrent severe without psychotic features: Secondary | ICD-10-CM

## 2020-08-09 DIAGNOSIS — F411 Generalized anxiety disorder: Secondary | ICD-10-CM | POA: Diagnosis not present

## 2020-08-09 DIAGNOSIS — G4700 Insomnia, unspecified: Secondary | ICD-10-CM

## 2020-08-09 DIAGNOSIS — F99 Mental disorder, not otherwise specified: Secondary | ICD-10-CM

## 2020-08-09 MED ORDER — ARIPIPRAZOLE 5 MG PO TABS: 5.0000 mg | ORAL_TABLET | Freq: Every day | ORAL | 2 refills | Status: DC

## 2020-08-09 MED ORDER — LAMOTRIGINE 200 MG PO TABS: 200.0000 mg | ORAL_TABLET | Freq: Every day | ORAL | 2 refills | Status: DC

## 2020-08-09 MED ORDER — HYDROXYZINE HCL 50 MG PO TABS: 50.0000 mg | ORAL_TABLET | Freq: Three times a day (TID) | ORAL | 2 refills | Status: DC | PRN

## 2020-08-09 MED ORDER — MIRTAZAPINE 45 MG PO TABS: 45.0000 mg | ORAL_TABLET | Freq: Every day | ORAL | 2 refills | Status: DC

## 2020-08-09 MED ORDER — ZOLPIDEM TARTRATE 5 MG PO TABS: 5.0000 mg | ORAL_TABLET | Freq: Every evening | ORAL | 4 refills | Status: DC | PRN

## 2020-08-09 NOTE — Progress Notes (Signed)
BH MD/PA/NP OP Progress Note Virtual Visit via Telephone Note  I connected with Louis [name] on 08/09/20 at  4:00 PM EST by telephone and verified that I am speaking with the correct person using two identifiers.  Location: Patient: home Provider: Clinic   I discussed the limitations, risks, security and privacy concerns of performing an evaluation and management service by telephone and the availability of in person appointments. I also discussed with the patient that there may be a patient responsible charge related to this service. The patient expressed understanding and agreed to proceed.   I provided 30 minutes of non-face-to-face time during this encounter.   08/09/2020 9:24 AM Louis [name]  MRN:  [id number]  Chief Complaint: "The medications have been doing great. Im always depressed but its better"  HPI: 50 year old male seen today for follow up psychiatric evaluation.  He has a psychiatric history of PTSD, anxiety, bipolar 2 disorder, and depression. He is currently managed on mirtazapine 45 mg nightly, Lamictal 200 mg nightly, and hydroxyzine 25mg  three times daily as needed. He notes that his medications are somewhat effective in managing his psychiatric conditions.  Today he was unable to log in virtually so his exam was done over the phone.  During exam he was pleasant, cooperative, and engaged in conversation.  He informed provider that since starting Ambien things have been going great and notes that he sleeps at least 7 hours nightly.  He informed provider that his depression and anxiety to have somewhat improved.  He notes that he lacks confidence in himself and motivation to do things that he once did.  Provider conducted a GAD-7 and patient scored a 16, at his last visit he scored a 19.  Provider also conducted a PHQ-9 and patient scored a 15, at his last visit he scored a 19.  Patient notes that he dislikes his job and dislikes leaving his home.  He endorses symptoms of  hypomania such as distractibility, irritability, and racing thoughts.  Today he denies SI/HI/VAH or paranoia.   Today he is agreeable to starting Abilify 5 mg to help manage the symptoms of hypomania and depression.  He also agreeable to increasing hydroxyzine 25 mg 3 times daily to 50 mg 3 times daily to help manage anxiety. Potential side effects of medication and risks vs benefits of treatment vs non-treatment were explained and discussed. All questions were answered.  He will continue all other meds medications as prescribed and  follow-up with therapy for counseling. No other concerns noted at this time Visit Diagnosis:    ICD-10-CM   1. Generalized anxiety disorder  F41.1 hydrOXYzine (ATARAX/VISTARIL) 50 MG tablet    mirtazapine (REMERON) 45 MG tablet  2. Bipolar 2 disorder (HCC)  F31.81 lamoTRIgine (LAMICTAL) 200 MG tablet    ARIPiprazole (ABILIFY) 5 MG tablet  3. Insomnia  G47.00 mirtazapine (REMERON) 45 MG tablet  4. Severe episode of recurrent major depressive disorder, without psychotic features (HCC)  F33.2 mirtazapine (REMERON) 45 MG tablet    ARIPiprazole (ABILIFY) 5 MG tablet  5. Insomnia due to other mental disorder  F51.05 zolpidem (AMBIEN) 5 MG tablet   F99     Past Psychiatric History: PTSD, anxiety, bipolar 2 disorder, and depression  Past Medical History:  Past Medical History:  Diagnosis Date   Anxiety    Back pain    Bipolar disorder (HCC)    Chronic lower back pain 03/09/2014   For over 20 years- since serving in the [employer].  Lower  and cervical pain.    Closed bicondylar fracture of proximal end of right tibia    Depression    Diarrhea    Headache    Hypercholesterolemia    Hypertension    Insomnia    Memory loss    due to attempted suicide by hanging   Migraine    Pain of right  arm 03/09/2014   Pneumonia    as a baby   Post traumatic stress disorder    Spondylosis     Past Surgical History:  Procedure Laterality Date   MULTIPLE  TOOTH EXTRACTIONS     ORIF TIBIA FRACTURE Left 04/2014   dr Turner Daniels   ORIF TIBIA PLATEAU Right 05/10/2014   Procedure: OPEN REDUCTION INTERNAL FIXATION (ORIF)RIGHT PROXIMAL TIBIA FRACTURE;  Surgeon: Nestor Lewandowsky, MD;  Location: [facility] OR;  Service: Orthopedics;  Laterality: Right;    Family Psychiatric History: Mother anxiety  Family History:  Family History  Problem Relation Age of Onset   Stroke Father     Social History:  Social History   Socioeconomic History   Marital status: Single    Spouse name: Not on file   Number of children: 1   Years of education: HS +   Highest education level: Not on file  Occupational History   Not on file  Tobacco Use   Smoking status: Never Smoker   Smokeless tobacco: Never Used  Substance and Sexual Activity   Alcohol use: No   Drug use: No   Sexual activity: Not on file  Other Topics Concern   Not on file  Social History Narrative   Patient is single and lives alone.   Patient has one child.   Patient has high school education and some college.   Patient is currently unemployed.   Patient is left-handed.   Patient drinks a 2 liter of soda on most days.         Social Determinants of Health   Financial Resource Strain: Low Risk    Difficulty of Paying Living Expenses: Not hard at all  Food Insecurity: No Food Insecurity   Worried About Programme researcher, broadcasting/film/video in the Last Year: Never true   Ran Out of Food in the Last Year: Never true  Transportation Needs: No Transportation Needs   Lack of Transportation (Medical): No   Lack of Transportation (Non-Medical): No  Physical Activity: Inactive   Days of Exercise per Week: 0 days   Minutes of Exercise per Session: 0 min  Stress: Stress Concern Present   Feeling of Stress : Very much  Social Connections: Socially Isolated   Frequency of Communication with Friends and Family: Three times a week   Frequency of Social Gatherings with Friends and Family: More than  three times a week   Attends Religious Services: Never   Database administrator or Organizations: No   Attends Banker Meetings: Never   Marital Status: Divorced    Allergies:  Allergies  Allergen Reactions   Fish Allergy Other (See Comments)   Anthrax Vaccine     Turned green, memory loss   Iodine Other (See Comments)   Other Diarrhea, Itching, Palpitations, Rash and Other (See Comments)    Green peppers, onions and seafood    Metabolic Disorder Labs: No results found for: HGBA1C, MPG No results found for: PROLACTIN No results found for: CHOL, TRIG, HDL, CHOLHDL, VLDL, LDLCALC No results found for: TSH  Therapeutic Level Labs: No results found for: LITHIUM No  results found for: VALPROATE No components found for:  CBMZ  Current Medications: Current Outpatient Medications  Medication Sig Dispense Refill   ARIPiprazole (ABILIFY) 5 MG tablet Take 1 tablet (5 mg total) by mouth daily. 30 tablet 2   gabapentin (NEURONTIN) 600 MG tablet Take 1 tablet (600 mg total) by mouth 3 (three) times daily. 90 tablet 4   HYDROcodone-acetaminophen (NORCO/VICODIN) 5-325 MG tablet Take 1-2 tablets by mouth every 6 hours as needed for pain and/or cough. 7 tablet 0   hydrOXYzine (ATARAX/VISTARIL) 50 MG tablet Take 1 tablet (50 mg total) by mouth 3 (three) times daily as needed. 90 tablet 2   ibuprofen (ADVIL,MOTRIN) 800 MG tablet Take 800 mg by mouth every 8 (eight) hours. As needed for back pain     lamoTRIgine (LAMICTAL) 200 MG tablet Take 1 tablet (200 mg total) by mouth daily. 30 tablet 2   mirtazapine (REMERON) 45 MG tablet Take 1 tablet (45 mg total) by mouth at bedtime. 30 tablet 2   pravastatin (PRAVACHOL) 20 MG tablet Take 20 mg by mouth at bedtime.     zolpidem (AMBIEN) 5 MG tablet Take 1 tablet (5 mg total) by mouth at bedtime as needed for sleep. 15 tablet 4   No current facility-administered medications for this visit.     Musculoskeletal: Strength  & Muscle Tone: Unable to assess due to telephone visit Gait & Station: Unable to assess due to telephone visit Patient leans: N/A  Psychiatric Specialty Exam: Review of Systems  There were no vitals taken for this visit.There is no height or weight on file to calculate BMI.  General Appearance: Unable to assess due to telephone visit  Eye Contact:  Good  Speech:  Clear and Coherent and Normal Rate  Volume:  Normal  Mood:  Anxious and Depressed  Affect:  Unable to assess due to telephone visit  Thought Process:  Coherent, Goal Directed and Linear  Orientation:  Full (Time, Place, and Person)  Thought Content: WDL and Logical   Suicidal Thoughts:  No  Homicidal Thoughts:  No  Memory:  Immediate;   Good Recent;   Good Remote;   Good  Judgement:  Good  Insight:  Good  Psychomotor Activity:  Unable to assess due to telephone visit  Concentration:  Concentration: Good and Attention Span: Good  Recall:  Good  Fund of Knowledge: Good  Language: Good  Akathisia:  No  Handed:  Left  AIMS (if indicated):Not done  Assets:  Communication Skills Desire for Improvement Financial Resources/Insurance Housing Social Support  ADL's:  Intact  Cognition: WNL  Sleep:  Good   Screenings: GAD-7   Flowsheet Row Clinical Support from 08/09/2020 in [facility] Office Visit from 05/17/2020 in [facility]  Total GAD-7 Score 16 19    PHQ2-9   Flowsheet Row Clinical Support from 08/09/2020 in [facility] Counselor from 07/26/2020 in [facility] Office Visit from 05/17/2020 in [facility] Office Visit from 03/28/2015 in [facility] Office Visit from 02/14/2015 in [facility]  PHQ-2 Total Score 6 6 6 6 6   PHQ-9 Total Score 15 19 19  -- --    Flowsheet Row Counselor from 07/26/2020 in  [facility]  C-SSRS RISK CATEGORY Low Risk       Assessment and Plan: Patient notes that his anxiety, sleep, and depression has somewhat improved since  his last visit.  He however endorses symptoms of hypomania such as irritability, racing thoughts, and distractibility.  Today he is agreeable to starting Abilify 5 mg to help manage the symptoms.  He also agreeable to increasing hydroxyzine 25 mg 3 times daily to 50 mg 3 times daily to help manage anxiety.  He will continue all other medications as prescribed.  1. Generalized anxiety disorder  Increased- hydrOXYzine (ATARAX/VISTARIL) 50 MG tablet; Take 1 tablet (50 mg total) by mouth 3 (three) times daily as needed.  Dispense: 90 tablet; Refill: 2 Continue- mirtazapine (REMERON) 45 MG tablet; Take 1 tablet (45 mg total) by mouth at bedtime.  Dispense: 30 tablet; Refill: 2  2. Bipolar 2 disorder (HCC)  Continue- lamoTRIgine (LAMICTAL) 200 MG tablet; Take 1 tablet (200 mg total) by mouth daily.  Dispense: 30 tablet; Refill: 2 Start- ARIPiprazole (ABILIFY) 5 MG tablet; Take 1 tablet (5 mg total) by mouth daily.  Dispense: 30 tablet; Refill: 2  3. Insomnia  Continue- mirtazapine (REMERON) 45 MG tablet; Take 1 tablet (45 mg total) by mouth at bedtime.  Dispense: 30 tablet; Refill: 2  4. Severe episode of recurrent major depressive disorder, without psychotic features (HCC)  Continue- mirtazapine (REMERON) 45 MG tablet; Take 1 tablet (45 mg total) by mouth at bedtime.  Dispense: 30 tablet; Refill: 2 Start- ARIPiprazole (ABILIFY) 5 MG tablet; Take 1 tablet (5 mg total) by mouth daily.  Dispense: 30 tablet; Refill: 2  5. Insomnia due to other mental disorder  Continue- zolpidem (AMBIEN) 5 MG tablet; Take 1 tablet (5 mg total) by mouth at bedtime as needed for sleep.  Dispense: 15 tablet; Refill: 4  Follow-up in 3 months Follow-up with therapy  Shanna Cisco, NP 08/09/2020, 9:24 AM

## 2020-08-13 ENCOUNTER — Other Ambulatory Visit (HOSPITAL_COMMUNITY): Payer: Self-pay | Admitting: Psychiatry

## 2020-08-13 DIAGNOSIS — F411 Generalized anxiety disorder: Secondary | ICD-10-CM

## 2020-09-10 ENCOUNTER — Other Ambulatory Visit: Payer: Self-pay

## 2020-09-10 ENCOUNTER — Ambulatory Visit (INDEPENDENT_AMBULATORY_CARE_PROVIDER_SITE_OTHER): Payer: Medicaid Other | Admitting: Licensed Clinical Social Worker

## 2020-09-10 DIAGNOSIS — F411 Generalized anxiety disorder: Secondary | ICD-10-CM

## 2020-09-10 DIAGNOSIS — F4310 Post-traumatic stress disorder, unspecified: Secondary | ICD-10-CM

## 2020-09-10 DIAGNOSIS — F3181 Bipolar II disorder: Secondary | ICD-10-CM

## 2020-09-10 DIAGNOSIS — F332 Major depressive disorder, recurrent severe without psychotic features: Secondary | ICD-10-CM
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
## 2020-09-25 ENCOUNTER — Ambulatory Visit (INDEPENDENT_AMBULATORY_CARE_PROVIDER_SITE_OTHER): Payer: Medicaid Other | Admitting: Licensed Clinical Social Worker

## 2020-09-25 ENCOUNTER — Other Ambulatory Visit: Payer: Self-pay

## 2020-09-25 DIAGNOSIS — F411 Generalized anxiety disorder: Secondary | ICD-10-CM

## 2020-09-25 DIAGNOSIS — F332 Major depressive disorder, recurrent severe without psychotic features: Secondary | ICD-10-CM | POA: Diagnosis not present

## 2020-09-25 DIAGNOSIS — F4310 Post-traumatic stress disorder, unspecified: Secondary | ICD-10-CM
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
## 2020-10-19 ENCOUNTER — Ambulatory Visit (INDEPENDENT_AMBULATORY_CARE_PROVIDER_SITE_OTHER): Payer: Medicaid Other | Admitting: Licensed Clinical Social Worker

## 2020-10-19 ENCOUNTER — Other Ambulatory Visit: Payer: Self-pay

## 2020-10-19 DIAGNOSIS — F4310 Post-traumatic stress disorder, unspecified: Secondary | ICD-10-CM | POA: Diagnosis not present

## 2020-10-19 DIAGNOSIS — F3181 Bipolar II disorder: Secondary | ICD-10-CM | POA: Diagnosis not present

## 2020-10-19 DIAGNOSIS — F411 Generalized anxiety disorder: Secondary | ICD-10-CM | POA: Diagnosis not present
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
## 2020-11-05 ENCOUNTER — Other Ambulatory Visit (HOSPITAL_COMMUNITY): Payer: Self-pay | Admitting: Psychiatry

## 2020-11-05 DIAGNOSIS — F332 Major depressive disorder, recurrent severe without psychotic features: Secondary | ICD-10-CM

## 2020-11-05 DIAGNOSIS — F3181 Bipolar II disorder: Secondary | ICD-10-CM

## 2020-11-06 ENCOUNTER — Encounter (HOSPITAL_COMMUNITY): Payer: Self-pay | Admitting: Psychiatry

## 2020-11-06 ENCOUNTER — Telehealth (INDEPENDENT_AMBULATORY_CARE_PROVIDER_SITE_OTHER): Payer: Medicaid Other | Admitting: Psychiatry

## 2020-11-06 DIAGNOSIS — F3181 Bipolar II disorder: Secondary | ICD-10-CM | POA: Diagnosis not present

## 2020-11-06 DIAGNOSIS — G4700 Insomnia, unspecified: Secondary | ICD-10-CM

## 2020-11-06 DIAGNOSIS — F99 Mental disorder, not otherwise specified: Secondary | ICD-10-CM

## 2020-11-06 DIAGNOSIS — F4310 Post-traumatic stress disorder, unspecified: Secondary | ICD-10-CM | POA: Diagnosis not present

## 2020-11-06 DIAGNOSIS — G8929 Other chronic pain: Secondary | ICD-10-CM

## 2020-11-06 DIAGNOSIS — F411 Generalized anxiety disorder: Secondary | ICD-10-CM

## 2020-11-06 DIAGNOSIS — F332 Major depressive disorder, recurrent severe without psychotic features: Secondary | ICD-10-CM

## 2020-11-06 DIAGNOSIS — F5105 Insomnia due to other mental disorder: Secondary | ICD-10-CM

## 2020-11-06 DIAGNOSIS — M545 Low back pain, unspecified: Secondary | ICD-10-CM

## 2020-11-06 MED ORDER — ZOLPIDEM TARTRATE 5 MG PO TABS: 5.0000 mg | ORAL_TABLET | Freq: Every evening | ORAL | 4 refills | Status: DC | PRN

## 2020-11-06 MED ORDER — HYDROXYZINE HCL 50 MG PO TABS: 50.0000 mg | ORAL_TABLET | Freq: Three times a day (TID) | ORAL | 2 refills | Status: DC | PRN

## 2020-11-06 MED ORDER — ARIPIPRAZOLE 5 MG PO TABS: ORAL_TABLET | ORAL | 2 refills | Status: DC

## 2020-11-06 MED ORDER — MIRTAZAPINE 45 MG PO TABS: 45.0000 mg | ORAL_TABLET | Freq: Every day | ORAL | 2 refills | Status: DC

## 2020-11-06 MED ORDER — LAMOTRIGINE 200 MG PO TABS: ORAL_TABLET | ORAL | 2 refills | Status: DC

## 2020-11-06 NOTE — Progress Notes (Signed)
BH MD/PA/NP OP Progress Note Virtual Visit via Telephone Note  I connected with Louis [name] on 11/06/20 at  4:00 PM EDT by telephone and verified that I am speaking with the correct person using two identifiers.  Location: Patient: home Provider: Clinic   I discussed the limitations, risks, security and privacy concerns of performing an evaluation and management service by telephone and the availability of in person appointments. I also discussed with the patient that there may be a patient responsible charge related to this service. The patient expressed understanding and agreed to proceed.   I provided 30 minutes of non-face-to-face time during this encounter.   11/06/2020 4:26 PM Louis [name]  MRN:  [id number]  Chief Complaint: "My mood feels a little more stable"  HPI: 50 year old male seen today for follow up psychiatric evaluation.  He has a psychiatric history of PTSD, anxiety, bipolar 2 disorder, and depression. He is currently managed on mirtazapine 45 mg nightly, Lamictal 200 mg nightly, Abilify 5 mg daily, gabapentin 600 mg 3 times daily (received from his PCP) and hydroxyzine 50 mg three times daily as needed. He notes that his medications are  effective in managing his psychiatric conditions.  Today he was unable to log in virtually so his exam was done over the phone.  During exam he was pleasant, cooperative, and engaged in conversation.  He informed provider that since his last visit his anxiety and depression has somewhat improved.  Provider conducted a GAD-7 and patient scored a 14, at his last visit he scored a 16.  Provider also conducted a PHQ-9 and patient scored a 14, at his last visit he scored a 15.  He also notes that he feels his mood is more stable since starting Abilify.  He does endorse racing thoughts and irritability however denies other symptoms of mania.  Today he denies SI/HI/VAH.  At times he notes that he feels paranoid.  Patient endorses adequate sleep  noting that he sleeps 5 to 6 hours nightly.  He also endorses having a good appetite.  Patient notes that he has constant knee, back, and leg pain.  He notes that he has neuropathy which is managed with gabapentin and Percocet.  No medication changes made today.  Patient agreeable to continue medication as prescribed.  He will follow-up with therapy for counseling. No other concerns noted at this time Visit Diagnosis:    ICD-10-CM   1. Bipolar 2 disorder (HCC)  F31.81 ARIPiprazole (ABILIFY) 5 MG tablet    lamoTRIgine (LAMICTAL) 200 MG tablet  2. Severe episode of recurrent major depressive disorder, without psychotic features (HCC)  F33.2 ARIPiprazole (ABILIFY) 5 MG tablet    mirtazapine (REMERON) 45 MG tablet  3. PTSD (post-traumatic stress disorder)  F43.10   4. Insomnia  G47.00 mirtazapine (REMERON) 45 MG tablet  5. Chronic lower back pain  M54.50    G89.29   6. Generalized anxiety disorder  F41.1 hydrOXYzine (ATARAX/VISTARIL) 50 MG tablet    mirtazapine (REMERON) 45 MG tablet  7. Insomnia due to other mental disorder  F51.05 zolpidem (AMBIEN) 5 MG tablet   F99     Past Psychiatric History: PTSD, anxiety, bipolar 2 disorder, and depression  Past Medical History:  Past Medical History:  Diagnosis Date  . Anxiety   . Back pain   . Bipolar disorder (HCC)   . Chronic lower back pain 03/09/2014   For over 20 years- since serving in the [employer].  Lower and cervical pain.   Marland Kitchen  Closed bicondylar fracture of proximal end of right tibia   . Depression   . Diarrhea   . Headache   . Hypercholesterolemia   . Hypertension   . Insomnia   . Memory loss    due to attempted suicide by hanging  . Migraine   . Pain of right  arm 03/09/2014  . Pneumonia    as a baby  . Post traumatic stress disorder   . Spondylosis     Past Surgical History:  Procedure Laterality Date  . MULTIPLE TOOTH EXTRACTIONS    . ORIF TIBIA FRACTURE Left 04/2014   dr Turner Daniels  . ORIF TIBIA PLATEAU Right 05/10/2014    Procedure: OPEN REDUCTION INTERNAL FIXATION (ORIF)RIGHT PROXIMAL TIBIA FRACTURE;  Surgeon: Nestor Lewandowsky, MD;  Location: [facility] OR;  Service: Orthopedics;  Laterality: Right;    Family Psychiatric History: Mother anxiety  Family History:  Family History  Problem Relation Age of Onset  . Stroke Father     Social History:  Social History   Socioeconomic History  . Marital status: Single    Spouse name: Not on file  . Number of children: 1  . Years of education: HS +  . Highest education level: Not on file  Occupational History  . Not on file  Tobacco Use  . Smoking status: Never Smoker  . Smokeless tobacco: Never Used  Substance and Sexual Activity  . Alcohol use: No  . Drug use: No  . Sexual activity: Not on file  Other Topics Concern  . Not on file  Social History Narrative   Patient is single and lives alone.   Patient has one child.   Patient has high school education and some college.   Patient is currently unemployed.   Patient is left-handed.   Patient drinks a 2 liter of soda on most days.         Social Determinants of Health   Financial Resource Strain: Low Risk   . Difficulty of Paying Living Expenses: Not hard at all  Food Insecurity: No Food Insecurity  . Worried About Programme researcher, broadcasting/film/video in the Last Year: Never true  . Ran Out of Food in the Last Year: Never true  Transportation Needs: No Transportation Needs  . Lack of Transportation (Medical): No  . Lack of Transportation (Non-Medical): No  Physical Activity: Inactive  . Days of Exercise per Week: 0 days  . Minutes of Exercise per Session: 0 min  Stress: Stress Concern Present  . Feeling of Stress : Very much  Social Connections: Socially Isolated  . Frequency of Communication with Friends and Family: Three times a week  . Frequency of Social Gatherings with Friends and Family: More than three times a week  . Attends Religious Services: Never  . Active Member of Clubs or Organizations: No  .  Attends Banker Meetings: Never  . Marital Status: Divorced    Allergies:  Allergies  Allergen Reactions  . Fish Allergy Other (See Comments)  . Anthrax Vaccine     Turned green, memory loss  . Iodine Other (See Comments)  . Other Diarrhea, Itching, Palpitations, Rash and Other (See Comments)    Green peppers, onions and seafood    Metabolic Disorder Labs: No results found for: HGBA1C, MPG No results found for: PROLACTIN No results found for: CHOL, TRIG, HDL, CHOLHDL, VLDL, LDLCALC No results found for: TSH  Therapeutic Level Labs: No results found for: LITHIUM No results found for: VALPROATE No components  found for:  CBMZ  Current Medications: Current Outpatient Medications  Medication Sig Dispense Refill  . ARIPiprazole (ABILIFY) 5 MG tablet TAKE 1 TABLET(5 MG) BY MOUTH DAILY 30 tablet 2  . gabapentin (NEURONTIN) 600 MG tablet Take 1 tablet (600 mg total) by mouth 3 (three) times daily. 90 tablet 4  . HYDROcodone-acetaminophen (NORCO/VICODIN) 5-325 MG tablet Take 1-2 tablets by mouth every 6 hours as needed for pain and/or cough. 7 tablet 0  . hydrOXYzine (ATARAX/VISTARIL) 50 MG tablet Take 1 tablet (50 mg total) by mouth 3 (three) times daily as needed. 90 tablet 2  . ibuprofen (ADVIL,MOTRIN) 800 MG tablet Take 800 mg by mouth every 8 (eight) hours. As needed for back pain    . lamoTRIgine (LAMICTAL) 200 MG tablet TAKE 1 TABLET(200 MG) BY MOUTH DAILY 30 tablet 2  . mirtazapine (REMERON) 45 MG tablet Take 1 tablet (45 mg total) by mouth at bedtime. 30 tablet 2  . pravastatin (PRAVACHOL) 20 MG tablet Take 20 mg by mouth at bedtime.    Marland Kitchen zolpidem (AMBIEN) 5 MG tablet Take 1 tablet (5 mg total) by mouth at bedtime as needed for sleep. 15 tablet 4   No current facility-administered medications for this visit.     Musculoskeletal: Strength & Muscle Tone: Unable to assess due to telephone visit Gait & Station: Unable to assess due to telephone visit Patient  leans: N/A  Psychiatric Specialty Exam: Review of Systems  There were no vitals taken for this visit.There is no height or weight on file to calculate BMI.  General Appearance: Unable to assess due to telephone visit  Eye Contact:  Good  Speech:  Clear and Coherent and Normal Rate  Volume:  Normal  Mood:  Euthymic and Patient notes at times she becomes anxious and depressed however informed provider that he is able to cope with it  Affect:  Unable to assess due to telephone visit  Thought Process:  Coherent, Goal Directed and Linear  Orientation:  Full (Time, Place, and Person)  Thought Content: WDL and Logical   Suicidal Thoughts:  No  Homicidal Thoughts:  No  Memory:  Immediate;   Good Recent;   Good Remote;   Good  Judgement:  Good  Insight:  Good  Psychomotor Activity:  Unable to assess due to telephone visit  Concentration:  Concentration: Good and Attention Span: Good  Recall:  Good  Fund of Knowledge: Good  Language: Good  Akathisia:  No  Handed:  Left  AIMS (if indicated):Not done  Assets:  Communication Skills Desire for Improvement Financial Resources/Insurance Housing Social Support  ADL's:  Intact  Cognition: WNL  Sleep:  Good   Screenings: GAD-7   Flowsheet Row Video Visit from 11/06/2020 in [facility] Clinical Support from 08/09/2020 in [facility] Office Visit from 05/17/2020 in [facility]  Total GAD-7 Score 14 16 19     PHQ2-9   Flowsheet Row Video Visit from 11/06/2020 in [facility] Clinical Support from 08/09/2020 in [facility] Counselor from 07/26/2020 in [facility] Office Visit from 05/17/2020 in [facility] Office Visit from 03/28/2015 in [facility]  PHQ-2 Total Score 4 6 6 6 6   PHQ-9 Total Score 14 15 19 19  --     Flowsheet Row Video Visit from 11/06/2020 in [facility] Counselor from 07/26/2020 in [facility]  C-SSRS RISK CATEGORY Error: Q7 should not be populated when Q6 is No Low Risk       Assessment and Plan: Patient notes that his anxiety, sleep, and depression has  improved since his last visit.  He notes that when he does experience anxiety and depression he is able to cope with it and notes that his mood has been stable on Abilify.  No medication changes made today.  Patient agreeable to continue medications prescribed.  1. Generalized anxiety disorder  Increased- hydrOXYzine (ATARAX/VISTARIL) 50 MG tablet; Take 1 tablet (50 mg total) by mouth 3 (three) times daily as needed.  Dispense: 90 tablet; Refill: 2 Continue- mirtazapine (REMERON) 45 MG tablet; Take 1 tablet (45 mg total) by mouth at bedtime.  Dispense: 30 tablet; Refill: 2  2. Bipolar 2 disorder (HCC)  Continue- lamoTRIgine (LAMICTAL) 200 MG tablet; Take 1 tablet (200 mg total) by mouth daily.  Dispense: 30 tablet; Refill: 2 Continue- ARIPiprazole (ABILIFY) 5 MG tablet; Take 1 tablet (5 mg total) by mouth daily.  Dispense: 30 tablet; Refill: 2  3. Insomnia  Continue- mirtazapine (REMERON) 45 MG tablet; Take 1 tablet (45 mg total) by mouth at bedtime.  Dispense: 30 tablet; Refill: 2  4. Severe episode of recurrent major depressive disorder, without psychotic features (HCC)  Continue- mirtazapine (REMERON) 45 MG tablet; Take 1 tablet (45 mg total) by mouth at bedtime.  Dispense: 30 tablet; Refill: 2 Continue- ARIPiprazole (ABILIFY) 5 MG tablet; Take 1 tablet (5 mg total) by mouth daily.  Dispense: 30 tablet; Refill: 2  5. Insomnia due to other mental disorder  Continue- zolpidem (AMBIEN) 5 MG tablet; Take 1 tablet (5 mg total) by mouth at bedtime as needed for sleep.  Dispense: 15 tablet; Refill: 4  Follow-up in 3 months Follow-up with therapy  Shanna CiscoBrittney E Sendy Pluta,  NP 11/06/2020, 4:26 PM

## 2020-11-14 ENCOUNTER — Other Ambulatory Visit: Payer: Self-pay

## 2020-11-14 ENCOUNTER — Ambulatory Visit (INDEPENDENT_AMBULATORY_CARE_PROVIDER_SITE_OTHER): Payer: Medicaid Other | Admitting: Licensed Clinical Social Worker

## 2020-11-14 DIAGNOSIS — F4310 Post-traumatic stress disorder, unspecified: Secondary | ICD-10-CM

## 2020-11-14 DIAGNOSIS — F3181 Bipolar II disorder: Secondary | ICD-10-CM | POA: Diagnosis not present

## 2020-11-14 DIAGNOSIS — F411 Generalized anxiety disorder: Secondary | ICD-10-CM
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
## 2020-12-13 ENCOUNTER — Other Ambulatory Visit: Payer: Self-pay

## 2020-12-13 ENCOUNTER — Ambulatory Visit (INDEPENDENT_AMBULATORY_CARE_PROVIDER_SITE_OTHER): Payer: Medicaid Other | Admitting: Licensed Clinical Social Worker

## 2020-12-13 DIAGNOSIS — F4310 Post-traumatic stress disorder, unspecified: Secondary | ICD-10-CM | POA: Diagnosis not present
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
## 2020-12-27 ENCOUNTER — Telehealth (HOSPITAL_COMMUNITY): Payer: Self-pay | Admitting: Licensed Clinical Social Worker

## 2020-12-27 NOTE — Telephone Encounter (Signed)
Patient calling to let Madelaine Bhat know he received a letter from [employer] regarding submitting documentation for proving patient's disability.  Pt states in the past, [employer] provided that.  Please call pt [phone] for clarification.

## 2021-01-10 ENCOUNTER — Ambulatory Visit (INDEPENDENT_AMBULATORY_CARE_PROVIDER_SITE_OTHER): Payer: Medicaid Other | Admitting: Licensed Clinical Social Worker

## 2021-01-10 ENCOUNTER — Other Ambulatory Visit: Payer: Self-pay

## 2021-01-10 DIAGNOSIS — F4310 Post-traumatic stress disorder, unspecified: Secondary | ICD-10-CM | POA: Diagnosis not present

## 2021-01-10 DIAGNOSIS — F3181 Bipolar II disorder: Secondary | ICD-10-CM
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
## 2021-01-30 ENCOUNTER — Other Ambulatory Visit (HOSPITAL_COMMUNITY): Payer: Self-pay | Admitting: Psychiatry

## 2021-01-30 DIAGNOSIS — F411 Generalized anxiety disorder: Secondary | ICD-10-CM

## 2021-02-05 ENCOUNTER — Encounter (HOSPITAL_COMMUNITY): Payer: Self-pay | Admitting: Psychiatry

## 2021-02-05 ENCOUNTER — Telehealth (INDEPENDENT_AMBULATORY_CARE_PROVIDER_SITE_OTHER): Payer: Medicaid Other | Admitting: Psychiatry

## 2021-02-05 ENCOUNTER — Other Ambulatory Visit: Payer: Self-pay

## 2021-02-05 DIAGNOSIS — F3181 Bipolar II disorder: Secondary | ICD-10-CM | POA: Diagnosis not present

## 2021-02-05 DIAGNOSIS — G4700 Insomnia, unspecified: Secondary | ICD-10-CM

## 2021-02-05 DIAGNOSIS — F332 Major depressive disorder, recurrent severe without psychotic features: Secondary | ICD-10-CM | POA: Diagnosis not present

## 2021-02-05 DIAGNOSIS — F5105 Insomnia due to other mental disorder: Secondary | ICD-10-CM

## 2021-02-05 DIAGNOSIS — F99 Mental disorder, not otherwise specified: Secondary | ICD-10-CM

## 2021-02-05 DIAGNOSIS — F411 Generalized anxiety disorder: Secondary | ICD-10-CM | POA: Diagnosis not present

## 2021-02-05 MED ORDER — ARIPIPRAZOLE 5 MG PO TABS: ORAL_TABLET | ORAL | 2 refills | Status: DC

## 2021-02-05 MED ORDER — LAMOTRIGINE 200 MG PO TABS: ORAL_TABLET | ORAL | 2 refills | Status: DC

## 2021-02-05 MED ORDER — ZOLPIDEM TARTRATE 5 MG PO TABS: 5.0000 mg | ORAL_TABLET | Freq: Every evening | ORAL | 3 refills | Status: DC | PRN

## 2021-02-05 MED ORDER — MIRTAZAPINE 45 MG PO TABS: 45.0000 mg | ORAL_TABLET | Freq: Every day | ORAL | 2 refills | Status: DC

## 2021-02-05 MED ORDER — HYDROXYZINE HCL 50 MG PO TABS: 50.0000 mg | ORAL_TABLET | Freq: Three times a day (TID) | ORAL | 2 refills | Status: DC | PRN

## 2021-02-05 NOTE — Progress Notes (Signed)
BH MD/PA/NP OP Progress Note Virtual Visit via Telephone Note  I connected with Louis [name] on 02/05/21 at  3:30 PM EDT by telephone and verified that I am speaking with the correct person using two identifiers.  Location: Patient: home Provider: Clinic   I discussed the limitations, risks, security and privacy concerns of performing an evaluation and management service by telephone and the availability of in person appointments. I also discussed with the patient that there may be a patient responsible charge related to this service. The patient expressed understanding and agreed to proceed.   I provided 30 minutes of non-face-to-face time during this encounter.   02/05/2021 1:57 PM [name]  MRN:  [id number]  Chief Complaint: "I'm getting more sleep"  HPI: 50 year old male seen today for follow up psychiatric evaluation.  He has a psychiatric history of PTSD, anxiety, bipolar 2 disorder, and depression. He is currently managed on mirtazapine 45 mg nightly, Lamictal 200 mg nightly, Abilify 5 mg daily, Ambien 5 mg nightly as needed, gabapentin 600 mg 3 times daily (received from his PCP) and hydroxyzine 50 mg three times daily as needed. He notes that his medications are  effective in managing his psychiatric conditions.   Today he was unable to log in virtually so his exam was done over the phone.  During exam he was pleasant, cooperative, and engaged in conversation.  He states since last visit he has been sleeping better.  He notes that he sleeps approximately 5 to 6 hours nightly.  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  Patient endorses some anxiety and depression however he reports his medications are effective in notes that he is able to cope with his anxiety and depression.  Provider conducted a GAD-7 and patient a 15, at his last visit he scored 14.  Provider also conducted a PHQ-9 and patient scored a 16, at his  last visit he scored a 14.  He notes that his mood is stable and denies SI/HI/VAH, mania, or paranoia.  He endorsed having adequate appetite.    Patient notes that he has constant knee, back, and leg pain.  He notes that he has neuropathy which is managed with gabapentin and Percocet by the pain clinic.  No medication changes made today.  Patient agreeable to continue medication as prescribed.  He will follow-up with therapy for counseling. No other concerns noted at this time Visit Diagnosis:    ICD-10-CM   1. Bipolar 2 disorder (HCC)  F31.81 ARIPiprazole (ABILIFY) 5 MG tablet    lamoTRIgine (LAMICTAL) 200 MG tablet    2. Severe episode of recurrent major depressive disorder, without psychotic features (HCC)  F33.2 ARIPiprazole (ABILIFY) 5 MG tablet    mirtazapine (REMERON) 45 MG tablet    3. Insomnia  G47.00 mirtazapine (REMERON) 45 MG tablet    4. Generalized anxiety disorder  F41.1 mirtazapine (REMERON) 45 MG tablet    hydrOXYzine (ATARAX/VISTARIL) 50 MG tablet    5. Insomnia due to other mental disorder  F51.05 zolpidem (AMBIEN) 5 MG tablet   F99       Past Psychiatric History: PTSD, anxiety, bipolar 2 disorder, and depression  Past Medical History:  Past Medical History:  Diagnosis Date   Anxiety    Back pain    Bipolar disorder (HCC)    Chronic lower back pain 03/09/2014   For over 20 years- since serving in the [employer].  Lower and cervical  pain.    Closed bicondylar fracture of proximal end of right tibia    Depression    Diarrhea    Headache    Hypercholesterolemia    Hypertension    Insomnia    Memory loss    due to attempted suicide by hanging   Migraine    Pain of right  arm 03/09/2014   Pneumonia    as a baby   Post traumatic stress disorder    Spondylosis     Past Surgical History:  Procedure Laterality Date   MULTIPLE TOOTH EXTRACTIONS     ORIF TIBIA FRACTURE Left 04/2014   dr Turner Danielsrowan   ORIF TIBIA PLATEAU Right 05/10/2014   Procedure: OPEN REDUCTION  INTERNAL FIXATION (ORIF)RIGHT PROXIMAL TIBIA FRACTURE;  Surgeon: Nestor LewandowskyFrank J Rowan, MD;  Location: [facility] OR;  Service: Orthopedics;  Laterality: Right;    Family Psychiatric History: Mother anxiety  Family History:  Family History  Problem Relation Age of Onset   Stroke Father     Social History:  Social History   Socioeconomic History   Marital status: Single    Spouse name: Not on file   Number of children: 1   Years of education: HS +   Highest education level: Not on file  Occupational History   Not on file  Tobacco Use   Smoking status: Never   Smokeless tobacco: Never  Substance and Sexual Activity   Alcohol use: No   Drug use: No   Sexual activity: Not on file  Other Topics Concern   Not on file  Social History Narrative   Patient is single and lives alone.   Patient has one child.   Patient has high school education and some college.   Patient is currently unemployed.   Patient is left-handed.   Patient drinks a 2 liter of soda on most days.         Social Determinants of Health   Financial Resource Strain: Low Risk    Difficulty of Paying Living Expenses: Not hard at all  Food Insecurity: No Food Insecurity   Worried About Programme researcher, broadcasting/film/videounning Out of Food in the Last Year: Never true   Ran Out of Food in the Last Year: Never true  Transportation Needs: No Transportation Needs   Lack of Transportation (Medical): No   Lack of Transportation (Non-Medical): No  Physical Activity: Inactive   Days of Exercise per Week: 0 days   Minutes of Exercise per Session: 0 min  Stress: Stress Concern Present   Feeling of Stress : Very much  Social Connections: Socially Isolated   Frequency of Communication with Friends and Family: Three times a week   Frequency of Social Gatherings with Friends and Family: More than three times a week   Attends Religious Services: Never   Database administratorActive Member of Clubs or Organizations: No   Attends BankerClub or Organization Meetings: Never   Marital Status:  Divorced    Allergies:  Allergies  Allergen Reactions   Fish Allergy Other (See Comments)   Anthrax Vaccine     Turned green, memory loss   Iodine Other (See Comments)   Other Diarrhea, Itching, Palpitations, Rash and Other (See Comments)    Green peppers, onions and seafood    Metabolic Disorder Labs: No results found for: HGBA1C, MPG No results found for: PROLACTIN No results found for: CHOL, TRIG, HDL, CHOLHDL, VLDL, LDLCALC No results found for: TSH  Therapeutic Level Labs: No results found for: LITHIUM No results found for: VALPROATE  No components found for:  CBMZ  Current Medications: Current Outpatient Medications  Medication Sig Dispense Refill   ARIPiprazole (ABILIFY) 5 MG tablet TAKE 1 TABLET(5 MG) BY MOUTH DAILY 30 tablet 2   gabapentin (NEURONTIN) 600 MG tablet Take 1 tablet (600 mg total) by mouth 3 (three) times daily. 90 tablet 4   HYDROcodone-acetaminophen (NORCO/VICODIN) 5-325 MG tablet Take 1-2 tablets by mouth every 6 hours as needed for pain and/or cough. 7 tablet 0   hydrOXYzine (ATARAX/VISTARIL) 50 MG tablet Take 1 tablet (50 mg total) by mouth 3 (three) times daily as needed. 90 tablet 2   ibuprofen (ADVIL,MOTRIN) 800 MG tablet Take 800 mg by mouth every 8 (eight) hours. As needed for back pain     lamoTRIgine (LAMICTAL) 200 MG tablet TAKE 1 TABLET(200 MG) BY MOUTH DAILY 30 tablet 2   mirtazapine (REMERON) 45 MG tablet Take 1 tablet (45 mg total) by mouth at bedtime. 30 tablet 2   pravastatin (PRAVACHOL) 20 MG tablet Take 20 mg by mouth at bedtime.     zolpidem (AMBIEN) 5 MG tablet Take 1 tablet (5 mg total) by mouth at bedtime as needed for sleep. 15 tablet 3   No current facility-administered medications for this visit.     Musculoskeletal: Strength & Muscle Tone:  Unable to assess due to telephone visit Gait & Station:  Unable to assess due to telephone visit Patient leans: N/A  Psychiatric Specialty Exam: Review of Systems  There were no  vitals taken for this visit.There is no height or weight on file to calculate BMI.  General Appearance:  Unable to assess due to telephone visit  Eye Contact:  Good  Speech:  Clear and Coherent and Normal Rate  Volume:  Normal  Mood:  Euthymic  Affect:   Unable to assess due to telephone visit  Thought Process:  Coherent, Goal Directed and Linear  Orientation:  Full (Time, Place, and Person)  Thought Content: WDL and Logical   Suicidal Thoughts:  No  Homicidal Thoughts:  No  Memory:  Immediate;   Good Recent;   Good Remote;   Good  Judgement:  Good  Insight:  Good  Psychomotor Activity:   Unable to assess due to telephone visit  Concentration:  Concentration: Good and Attention Span: Good  Recall:  Good  Fund of Knowledge: Good  Language: Good  Akathisia:  No  Handed:  Left  AIMS (if indicated):Not done  Assets:  Communication Skills Desire for Improvement Financial Resources/Insurance Housing Social Support  ADL's:  Intact  Cognition: WNL  Sleep:  Good   Screenings: GAD-7    Flowsheet Row Video Visit from 02/05/2021 in [facility] Video Visit from 11/06/2020 in [facility] Clinical Support from 08/09/2020 in [facility] Office Visit from 05/17/2020 in [facility]  Total GAD-7 Score 15 14 16 19       PHQ2-9    Flowsheet Row Video Visit from 02/05/2021 in [facility] Video Visit from 11/06/2020 in [facility] Clinical Support from 08/09/2020 in [facility] Counselor from 07/26/2020 in [facility] Office Visit from 05/17/2020 in [facility]  PHQ-2 Total Score 5 4 6 6 6   PHQ-9 Total Score 16 14 15 19 19       Flowsheet Row Video Visit from 02/05/2021 in [facility] Video Visit from 11/06/2020 in  [facility] Counselor from 07/26/2020 in [name]  C-SSRS RISK CATEGORY Error: Q7 should not be populated when Q6 is No Error: Q7 should not be populated when Q6 is No Low Risk        Assessment and Plan: Patient notes that his anxiety, sleep, and depression has  improved since his last visit.    No medication changes made today.  Patient agreeable to continue medications prescribed.  1. Generalized anxiety disorder  Increased- hydrOXYzine (ATARAX/VISTARIL) 50 MG tablet; Take 1 tablet (50 mg total) by mouth 3 (three) times daily as needed.  Dispense: 90 tablet; Refill: 2 Continue- mirtazapine (REMERON) 45 MG tablet; Take 1 tablet (45 mg total) by mouth at bedtime.  Dispense: 30 tablet; Refill: 2  2. Bipolar 2 disorder (HCC)  Continue- lamoTRIgine (LAMICTAL) 200 MG tablet; Take 1 tablet (200 mg total) by mouth daily.  Dispense: 30 tablet; Refill: 2 Continue- ARIPiprazole (ABILIFY) 5 MG tablet; Take 1 tablet (5 mg total) by mouth daily.  Dispense: 30 tablet; Refill: 2  3. Insomnia  Continue- mirtazapine (REMERON) 45 MG tablet; Take 1 tablet (45 mg total) by mouth at bedtime.  Dispense: 30 tablet; Refill: 2  4. Severe episode of recurrent major depressive disorder, without psychotic features (HCC)  Continue- mirtazapine (REMERON) 45 MG tablet; Take 1 tablet (45 mg total) by mouth at bedtime.  Dispense: 30 tablet; Refill: 2 Continue- ARIPiprazole (ABILIFY) 5 MG tablet; Take 1 tablet (5 mg total) by mouth daily.  Dispense: 30 tablet; Refill: 2  5. Insomnia due to other mental disorder  Continue- zolpidem (AMBIEN) 5 MG tablet; Take 1 tablet (5 mg total) by mouth at bedtime as needed for sleep.  Dispense: 15 tablet; Refill: 2  Follow-up in 3 months Follow-up with therapy  Shanna Cisco, NP 02/05/2021, 1:57 PM

## 2021-02-14 ENCOUNTER — Other Ambulatory Visit: Payer: Self-pay

## 2021-02-14 ENCOUNTER — Ambulatory Visit (HOSPITAL_COMMUNITY): Payer: Medicaid Other | Admitting: Licensed Clinical Social Worker

## 2021-02-14 DIAGNOSIS — F4310 Post-traumatic stress disorder, unspecified: Secondary | ICD-10-CM

## 2021-02-14 DIAGNOSIS — F332 Major depressive disorder, recurrent severe without psychotic features: Secondary | ICD-10-CM

## 2021-02-14 NOTE — Progress Notes (Signed)
   THERAPIST PROGRESS NOTE  Session Time: 41   Virtual Visit via Telephone Note  I connected with Louis [name] on 02/14/21 at  4:00 PM EDT by telephone and verified that I am speaking with the correct person using two identifiers.  Location: Patient: [name]  Provider: Providers home    I discussed the limitations, risks, security and privacy concerns of performing an evaluation and management service by telephone and the availability of in person appointments. I also discussed with the patient that there may be a patient responsible charge related to this service. The patient expressed understanding and agreed to proceed.  I discussed the assessment and treatment plan with the patient. The patient was provided an opportunity to ask questions and all were answered. The patient agreed with the plan and demonstrated an understanding of the instructions.  The patient was advised to call back or seek an in-person evaluation if the symptoms worsen or if the condition fails to improve as anticipated.  I provided 40 minutes of non-face-to-face time during this encounter.   [name], LCSW   Participation Level: Active  Behavioral Response: NAAlertAnxious and Depressed  Type of Therapy: Individual Therapy  Treatment Goals addressed: Diagnosis: depression bipolar disorder,  PTSD.   Interventions: CBT and Supportive  Summary: Louis [name] is a 50 y.o. male who presents with   Suicidal/Homicidal: NAwithout intent/plan  Therapist Response:  Pt was alert and oriented x 5. He was not observed as pt did session via phone. Larkin was pleasant and cooperative in session.   Primary stressor for pt is PTSD and legal. He reports that he still struggles to sleep at night. He reports about 5 hours asleep per night and then spend about 2 hours "Tossing and turning". LCSW spoke with pt about meditation to help calm his mind. "Daily Calm" video was provided for pt. Pt to attempt this  3 x per week to help increase sleep quality of sleep. Pt reports a decrease in flash backs and states he is only getting them about 1 x per week decrease from 3 x per week.  Pt states he did attempt the grounding exercise taught, touching each one of his figure tips to his thumb counting to 10 then backwards. Pt states he did not notice a difference despite the decrease in flash backs. Plan for pt is to utilize mediation video in session. Intervention used were supportive and mediation "Daily Calm"    Plan: Return again in 4  weeks.    [name], LCSW 02/14/2021

## 2021-03-14 ENCOUNTER — Ambulatory Visit (INDEPENDENT_AMBULATORY_CARE_PROVIDER_SITE_OTHER): Payer: Medicaid Other | Admitting: Licensed Clinical Social Worker

## 2021-03-14 DIAGNOSIS — F4310 Post-traumatic stress disorder, unspecified: Secondary | ICD-10-CM

## 2021-03-14 DIAGNOSIS — F29 Unspecified psychosis not due to a substance or known physiological condition: Secondary | ICD-10-CM

## 2021-03-14 DIAGNOSIS — F3181 Bipolar II disorder: Secondary | ICD-10-CM
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
## 2021-04-11 ENCOUNTER — Ambulatory Visit (HOSPITAL_COMMUNITY): Payer: Medicaid Other | Admitting: Licensed Clinical Social Worker

## 2021-05-07 ENCOUNTER — Ambulatory Visit (INDEPENDENT_AMBULATORY_CARE_PROVIDER_SITE_OTHER): Payer: Medicaid Other | Admitting: Licensed Clinical Social Worker

## 2021-05-07 DIAGNOSIS — F4310 Post-traumatic stress disorder, unspecified: Secondary | ICD-10-CM

## 2021-05-07 DIAGNOSIS — F3181 Bipolar II disorder: Secondary | ICD-10-CM
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
## 2021-05-08 ENCOUNTER — Encounter (HOSPITAL_COMMUNITY): Payer: Self-pay | Admitting: Psychiatry

## 2021-05-08 ENCOUNTER — Telehealth (INDEPENDENT_AMBULATORY_CARE_PROVIDER_SITE_OTHER): Payer: Medicaid Other | Admitting: Psychiatry

## 2021-05-08 DIAGNOSIS — F332 Major depressive disorder, recurrent severe without psychotic features: Secondary | ICD-10-CM | POA: Diagnosis not present

## 2021-05-08 DIAGNOSIS — F3181 Bipolar II disorder: Secondary | ICD-10-CM | POA: Diagnosis not present

## 2021-05-08 DIAGNOSIS — F99 Mental disorder, not otherwise specified: Secondary | ICD-10-CM

## 2021-05-08 DIAGNOSIS — F411 Generalized anxiety disorder: Secondary | ICD-10-CM | POA: Diagnosis not present

## 2021-05-08 DIAGNOSIS — F5105 Insomnia due to other mental disorder: Secondary | ICD-10-CM

## 2021-05-08 DIAGNOSIS — G4700 Insomnia, unspecified: Secondary | ICD-10-CM | POA: Diagnosis not present

## 2021-05-08 MED ORDER — ARIPIPRAZOLE 5 MG PO TABS: ORAL_TABLET | ORAL | 3 refills | Status: DC

## 2021-05-08 MED ORDER — LAMOTRIGINE 200 MG PO TABS: ORAL_TABLET | ORAL | 3 refills | Status: DC

## 2021-05-08 MED ORDER — ZOLPIDEM TARTRATE 5 MG PO TABS
5.0000 mg | ORAL_TABLET | Freq: Every evening | ORAL | 3 refills | Status: DC | PRN
Start: 2021-05-08 — End: 2021-06-05

## 2021-05-08 MED ORDER — MIRTAZAPINE 45 MG PO TABS: 45.0000 mg | ORAL_TABLET | Freq: Every day | ORAL | 3 refills | Status: DC

## 2021-05-08 MED ORDER — HYDROXYZINE HCL 50 MG PO TABS
50.0000 mg | ORAL_TABLET | Freq: Three times a day (TID) | ORAL | 3 refills | Status: DC | PRN
Start: 2021-05-08 — End: 2021-07-31

## 2021-05-08 NOTE — Progress Notes (Signed)
BH MD/PA/NP OP Progress Note Virtual Visit via Telephone Note  I connected with Louis [name] on 05/08/21 at  1:30 PM EST by telephone and verified that I am speaking with the correct person using two identifiers.  Location: Patient: home Provider: Clinic   I discussed the limitations, risks, security and privacy concerns of performing an evaluation and management service by telephone and the availability of in person appointments. I also discussed with the patient that there may be a patient responsible charge related to this service. The patient expressed understanding and agreed to proceed.   I provided 30 minutes of non-face-to-face time during this encounter.   05/08/2021 1:41 PM Louis [name]  MRN:  [id number]  Chief Complaint: "I'm hanging in there but  my therapist thinks Im delusional"  HPI: 50 year old male seen today for follow up psychiatric evaluation.  He has a psychiatric history of PTSD, anxiety, bipolar 2 disorder, and depression. He is currently managed on mirtazapine 45 mg nightly, Lamictal 200 mg nightly, Abilify 5 mg daily, Ambien 5 mg nightly as needed, gabapentin 600 mg 3 times daily (received from his PCP) and hydroxyzine 50 mg three times daily as needed. He notes that his medications are  effective in managing his psychiatric conditions.   Today he was unable to log in virtually so his exam was done over the phone.  During exam he was pleasant, cooperative, and engaged in conversation.  He informed Clinical research associate that he has been hanging in there however notes that his therapist believes he is delusional and paranoid.  Provider asked patient to elaborate on his thought process.  He notes at times he feels that he is being followed.  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  Patient notes that in the past he would put on surveillance goggles and walk around his yard to try to catch someone following him.  He also notes that in the past  he carried weapons in his yard because he felt like he was being followed.  Patient notes that currently he does not carry weapons and denies wanting to harm himself or others.  He also denies VAH.  Provider informed patient that his thought process does sound delusional and paranoid and notes that some of his belief may stem from his PTSD.  He endorsed understanding and agreed.  Provider informed patient that Abilify could be adjusted to help manage the symptoms.  He however notes that he prefers his medications not be adjusted.    Patient's therapist conducted a GAD-7 and a PHQ-9 on 05/07/2021.  Patient scored a 14 on his GAD-7, at his last visit he scored a 15.  On his PHQ-9 he scored a 17, at his last visit he scored a 16.  He endorses adequate sleep with the help of Ambien and reports his appetite is adequate.    Currently patient is not a danger to himself or others.  At this time he does not want to adjust medications and provider was agreeable to continue medications as prescribed.  He will follow-up with outpatient counseling for therapy.  No other concerns at this time.   Visit Diagnosis:    ICD-10-CM   1. Bipolar 2 disorder (HCC)  F31.81 ARIPiprazole (ABILIFY) 5 MG tablet    lamoTRIgine (LAMICTAL) 200 MG tablet    2. Severe episode of recurrent major depressive disorder, without psychotic features (HCC)  F33.2 ARIPiprazole (ABILIFY) 5 MG tablet  mirtazapine (REMERON) 45 MG tablet    3. Generalized anxiety disorder  F41.1 hydrOXYzine (ATARAX/VISTARIL) 50 MG tablet    mirtazapine (REMERON) 45 MG tablet    4. Insomnia  G47.00 mirtazapine (REMERON) 45 MG tablet    5. Insomnia due to other mental disorder  F51.05 zolpidem (AMBIEN) 5 MG tablet   F99       Past Psychiatric History: PTSD, anxiety, bipolar 2 disorder, and depression  Past Medical History:  Past Medical History:  Diagnosis Date   Anxiety    Back pain    Bipolar disorder (HCC)    Chronic lower back pain 03/09/2014    For over 20 years- since serving in the [employer].  Lower and cervical pain.    Closed bicondylar fracture of proximal end of right tibia    Depression    Diarrhea    Headache    Hypercholesterolemia    Hypertension    Insomnia    Memory loss    due to attempted suicide by hanging   Migraine    Pain of right  arm 03/09/2014   Pneumonia    as a baby   Post traumatic stress disorder    Spondylosis     Past Surgical History:  Procedure Laterality Date   MULTIPLE TOOTH EXTRACTIONS     ORIF TIBIA FRACTURE Left 04/2014   dr Turner Daniels   ORIF TIBIA PLATEAU Right 05/10/2014   Procedure: OPEN REDUCTION INTERNAL FIXATION (ORIF)RIGHT PROXIMAL TIBIA FRACTURE;  Surgeon: Nestor Lewandowsky, MD;  Location: [facility] OR;  Service: Orthopedics;  Laterality: Right;    Family Psychiatric History: Mother anxiety  Family History:  Family History  Problem Relation Age of Onset   Stroke Father     Social History:  Social History   Socioeconomic History   Marital status: Single    Spouse name: Not on file   Number of children: 1   Years of education: HS +   Highest education level: Not on file  Occupational History   Not on file  Tobacco Use   Smoking status: Never   Smokeless tobacco: Never  Substance and Sexual Activity   Alcohol use: No   Drug use: No   Sexual activity: Not on file  Other Topics Concern   Not on file  Social History Narrative   Patient is single and lives alone.   Patient has one child.   Patient has high school education and some college.   Patient is currently unemployed.   Patient is left-handed.   Patient drinks a 2 liter of soda on most days.         Social Determinants of Health   Financial Resource Strain: Low Risk    Difficulty of Paying Living Expenses: Not hard at all  Food Insecurity: No Food Insecurity   Worried About Programme researcher, broadcasting/film/video in the Last Year: Never true   Ran Out of Food in the Last Year: Never true  Transportation Needs: No Transportation  Needs   Lack of Transportation (Medical): No   Lack of Transportation (Non-Medical): No  Physical Activity: Inactive   Days of Exercise per Week: 0 days   Minutes of Exercise per Session: 0 min  Stress: Stress Concern Present   Feeling of Stress : Very much  Social Connections: Socially Isolated   Frequency of Communication with Friends and Family: Three times a week   Frequency of Social Gatherings with Friends and Family: More than three times a week   Attends Religious  Services: Never   Active Member of Clubs or Organizations: No   Attends Banker Meetings: Never   Marital Status: Divorced    Allergies:  Allergies  Allergen Reactions   Fish Allergy Other (See Comments)   Anthrax Vaccine     Turned green, memory loss   Iodine Other (See Comments)   Other Diarrhea, Itching, Palpitations, Rash and Other (See Comments)    Green peppers, onions and seafood    Metabolic Disorder Labs: No results found for: HGBA1C, MPG No results found for: PROLACTIN No results found for: CHOL, TRIG, HDL, CHOLHDL, VLDL, LDLCALC No results found for: TSH  Therapeutic Level Labs: No results found for: LITHIUM No results found for: VALPROATE No components found for:  CBMZ  Current Medications: Current Outpatient Medications  Medication Sig Dispense Refill   ARIPiprazole (ABILIFY) 5 MG tablet TAKE 1 TABLET(5 MG) BY MOUTH DAILY 30 tablet 3   gabapentin (NEURONTIN) 600 MG tablet Take 1 tablet (600 mg total) by mouth 3 (three) times daily. 90 tablet 4   HYDROcodone-acetaminophen (NORCO/VICODIN) 5-325 MG tablet Take 1-2 tablets by mouth every 6 hours as needed for pain and/or cough. 7 tablet 0   hydrOXYzine (ATARAX/VISTARIL) 50 MG tablet Take 1 tablet (50 mg total) by mouth 3 (three) times daily as needed. 90 tablet 3   ibuprofen (ADVIL,MOTRIN) 800 MG tablet Take 800 mg by mouth every 8 (eight) hours. As needed for back pain     lamoTRIgine (LAMICTAL) 200 MG tablet TAKE 1 TABLET(200  MG) BY MOUTH DAILY 30 tablet 3   mirtazapine (REMERON) 45 MG tablet Take 1 tablet (45 mg total) by mouth at bedtime. 30 tablet 3   pravastatin (PRAVACHOL) 20 MG tablet Take 20 mg by mouth at bedtime.     zolpidem (AMBIEN) 5 MG tablet Take 1 tablet (5 mg total) by mouth at bedtime as needed for sleep. 15 tablet 3   No current facility-administered medications for this visit.     Musculoskeletal: Strength & Muscle Tone:  Unable to assess due to telephone visit Gait & Station:  Unable to assess due to telephone visit Patient leans: N/A  Psychiatric Specialty Exam: Review of Systems  There were no vitals taken for this visit.There is no height or weight on file to calculate BMI.  General Appearance:  Unable to assess due to telephone visit  Eye Contact:  Good  Speech:  Clear and Coherent and Normal Rate  Volume:  Normal  Mood:  Euthymic and notes at times he is anxious and depressed however reports that he is able to cope with  Affect:   Unable to assess due to telephone visit  Thought Process:  Coherent, Goal Directed, and Linear  Orientation:  Full (Time, Place, and Person)  Thought Content: Logical, Delusions, and Paranoid Ideation   Suicidal Thoughts:  No  Homicidal Thoughts:  No  Memory:  Immediate;   Good Recent;   Good Remote;   Good  Judgement:  Good  Insight:  Good  Psychomotor Activity:   Unable to assess due to telephone visit  Concentration:  Concentration: Good and Attention Span: Good  Recall:  Good  Fund of Knowledge: Good  Language: Good  Akathisia:  No  Handed:  Left  AIMS (if indicated):Not done  Assets:  Communication Skills Desire for Improvement Financial Resources/Insurance Housing Social Support  ADL's:  Intact  Cognition: WNL  Sleep:  Good   Screenings: GAD-7    Advertising copywriter from 05/07/2021 in [facility] Video Visit from 02/05/2021 in [facility] Video Visit from 11/06/2020 in  [facility] Clinical Support from 08/09/2020 in [facility] Office Visit from 05/17/2020 in [facility]  Total GAD-7 Score 14 15 14 16 19       PHQ2-9    Flowsheet Row Counselor from 05/07/2021 in [facility] Counselor from 03/14/2021 in [facility] Video Visit from 02/05/2021 in [facility] Video Visit from 11/06/2020 in [facility] Clinical Support from 08/09/2020 in [facility]  PHQ-2 Total Score 5 6 5 4 6   PHQ-9 Total Score 17 17 16 14 15       Flowsheet Row Counselor from 03/14/2021 in [facility] Video Visit from 02/05/2021 in [facility] Video Visit from 11/06/2020 in [facility]  C-SSRS RISK CATEGORY Low Risk Error: Q7 should not be populated when Q6 is No Error: Q7 should not be populated when Q6 is No        Assessment and Plan: Patient presents with paranoia and delusional thoughts.  He however is not currently danger to himself or others and request that his medications not be readjusted.  No medication changes made today.  Patient agreeable to continue medication prescribed.   1. Generalized anxiety disorder  Continue- hydrOXYzine (ATARAX/VISTARIL) 50 MG tablet; Take 1 tablet (50 mg total) by mouth 3 (three) times daily as needed.  Dispense: 90 tablet; Refill: 3 Continue- mirtazapine (REMERON) 45 MG tablet; Take 1 tablet (45 mg total) by mouth at bedtime.  Dispense: 30 tablet; Refill: 3  2. Bipolar 2 disorder (HCC)  Continue- lamoTRIgine (LAMICTAL) 200 MG tablet; Take 1 tablet (200 mg total) by mouth daily.  Dispense: 30 tablet; Refill: 3 Continue- ARIPiprazole (ABILIFY) 5 MG tablet; Take 1 tablet (5 mg total) by mouth daily.  Dispense: 30 tablet; Refill: 3  3.  Insomnia  Continue- mirtazapine (REMERON) 45 MG tablet; Take 1 tablet (45 mg total) by mouth at bedtime.  Dispense: 30 tablet; Refill: 3  4. Severe episode of recurrent major depressive disorder, without psychotic features (HCC)  Continue- mirtazapine (REMERON) 45 MG tablet; Take 1 tablet (45 mg total) by mouth at bedtime.  Dispense: 30 tablet; Refill: 3 Continue- ARIPiprazole (ABILIFY) 5 MG tablet; Take 1 tablet (5 mg total) by mouth daily.  Dispense: 30 tablet; Refill: 3  5. Insomnia due to other mental disorder  Continue- zolpidem (AMBIEN) 5 MG tablet; Take 1 tablet (5 mg total) by mouth at bedtime as needed for sleep.  Dispense: 15 tablet; Refill: 3  Follow-up in 3 months Follow-up with therapy  [facility], NP 05/08/2021, 1:41 PM

## 2021-05-20 ENCOUNTER — Encounter: Payer: Self-pay | Admitting: Gastroenterology

## 2021-06-04 ENCOUNTER — Other Ambulatory Visit (HOSPITAL_COMMUNITY): Payer: Self-pay | Admitting: Psychiatry

## 2021-06-04 DIAGNOSIS — F5105 Insomnia due to other mental disorder: Secondary | ICD-10-CM

## 2021-06-04 DIAGNOSIS — F99 Mental disorder, not otherwise specified: Secondary | ICD-10-CM

## 2021-06-17 ENCOUNTER — Ambulatory Visit (INDEPENDENT_AMBULATORY_CARE_PROVIDER_SITE_OTHER): Payer: Medicaid Other | Admitting: Licensed Clinical Social Worker

## 2021-06-17 DIAGNOSIS — F332 Major depressive disorder, recurrent severe without psychotic features: Secondary | ICD-10-CM

## 2021-06-17 DIAGNOSIS — F4310 Post-traumatic stress disorder, unspecified: Secondary | ICD-10-CM

## 2021-06-17 DIAGNOSIS — F3181 Bipolar II disorder: Secondary | ICD-10-CM
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
## 2021-06-19 ENCOUNTER — Encounter: Payer: Self-pay | Admitting: Gastroenterology

## 2021-06-19 ENCOUNTER — Other Ambulatory Visit: Payer: Self-pay

## 2021-06-19 ENCOUNTER — Ambulatory Visit (INDEPENDENT_AMBULATORY_CARE_PROVIDER_SITE_OTHER): Payer: Medicaid Other | Admitting: Gastroenterology

## 2021-06-19 VITALS — BP 126/86 | HR 62 | Ht 69.0 in | Wt 238.2 lb

## 2021-06-19 DIAGNOSIS — Z1212 Encounter for screening for malignant neoplasm of rectum: Secondary | ICD-10-CM

## 2021-06-19 DIAGNOSIS — Z800 Family history of malignant neoplasm of digestive organs: Secondary | ICD-10-CM | POA: Diagnosis not present

## 2021-06-19 DIAGNOSIS — Z1211 Encounter for screening for malignant neoplasm of colon: Secondary | ICD-10-CM | POA: Diagnosis not present

## 2021-06-19 MED ORDER — SUPREP BOWEL PREP KIT 17.5-3.13-1.6 GM/177ML PO SOLN: 1.0000 | ORAL | 0 refills | Status: DC

## 2021-06-19 NOTE — Progress Notes (Signed)
Chief Complaint: For colonoscopy  Referring Provider:  Grayce Sessions, NP      ASSESSMENT AND PLAN;   #1. FH colon Ca (dad age 51)  #2.  Intermittent rectal bleeding attributed to longstanding Hoids   Plan: -Colon for further eval. -Use Preparation H on as needed basis. -[facility].  If unable to, check CBC and CMP at the time of colonoscopy.   Discussed risks & benefits of colonoscopy. Risks including rare perforation req laparotomy, bleeding after bx/polypectomy req blood transfusion, rarely missing neoplasms, risks of anesthesia/sedation, rare risk of damage to internal organs. Benefits outweigh the risks. Patient agrees to proceed. All the questions were answered. Pt consents to proceed.  HPI:    Louis [name] is a 51 y.o. male  With anxiety/depression/PTSD, HTN, HLD, LBP on pain meds Seen in GI clinic for colonoscopy.  With family history of colon cancer-dad at age 59  He never had colonoscopy.  Occ rectal bleeding x 30 +yrs, attributed to hemorrhoids.  He was "supposed to have hemorrhoidectomy" several years ago but he did not.  Denies having any constipation.  No perirectal pain.  No abdominal pain.  Occ diarrhea-normally has 2-3 BMs per day especially after eating.  Does not want blood work today as he has annual blood work performed on clinic at [facility] [address]  We will obtain records  Denies having any upper GI symptoms including nausea, vomiting, heartburn, regurgitation, odynophagia or dysphagia.  No weight loss.   Past Medical History:  Diagnosis Date   Anxiety    Back pain    Bipolar disorder (HCC)    Chronic lower back pain 03/09/2014   For over 20 years- since serving in the [employer].  Lower and cervical pain.    Closed bicondylar fracture of proximal end of right tibia    Depression    Diarrhea    Headache    Hypercholesterolemia    Hypertension    Insomnia    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   Migraine    Pain of right  arm 03/09/2014   Pneumonia    as a baby   Post traumatic stress disorder    Spondylosis     Past Surgical History:  Procedure Laterality Date   MULTIPLE TOOTH EXTRACTIONS     ORIF TIBIA FRACTURE Left 04/2014   dr Turner Daniels   ORIF TIBIA PLATEAU Right 05/10/2014   Procedure: OPEN REDUCTION INTERNAL FIXATION (ORIF)RIGHT PROXIMAL TIBIA FRACTURE;  Surgeon: Nestor Lewandowsky, MD;  Location: [facility];  Service: Orthopedics;  Laterality: Right;    Family History  Problem Relation Age of Onset   Stroke Father    Colon cancer Father    Esophageal cancer Neg Hx     Social History   Tobacco Use   Smoking status: Never   Smokeless tobacco: Never  Vaping Use   Vaping Use: Never used  Substance Use Topics   Alcohol use: No   Drug use: No    Current Outpatient Medications  Medication Sig Dispense Refill   ARIPiprazole (ABILIFY) 5 MG tablet TAKE 1 TABLET(5 MG) BY MOUTH DAILY 30 tablet 3   gabapentin (NEURONTIN) 600 MG tablet Take 1 tablet (600 mg total) by mouth 3 (three) times daily. 90 tablet 4   hydrOXYzine (ATARAX/VISTARIL) 50 MG tablet Take 1 tablet (50 mg total) by mouth 3 (three) times daily as needed. 90  tablet 3   ibuprofen (ADVIL,MOTRIN) 800 MG tablet Take 800 mg by mouth every 8 (eight) hours. As needed for back pain     lamoTRIgine (LAMICTAL) 200 MG tablet TAKE 1 TABLET(200 MG) BY MOUTH DAILY 30 tablet 3   mirtazapine (REMERON) 45 MG tablet Take 1 tablet (45 mg total) by mouth at bedtime. 30 tablet 3   oxyCODONE-acetaminophen (PERCOCET) 7.5-325 MG tablet Take 1 tablet by mouth every 6 (six) hours.     pravastatin (PRAVACHOL) 20 MG tablet Take 20 mg by mouth at bedtime.     zolpidem (AMBIEN) 5 MG tablet TAKE 1 TABLET(5 MG) BY MOUTH AT BEDTIME AS NEEDED FOR SLEEP 15 tablet 2   No current facility-administered medications for this visit.    Allergies  Allergen Reactions   Fish Allergy Other (See Comments)    Anthrax Vaccine     Turned green, memory loss   Iodine Other (See Comments)   Other Diarrhea, Itching, Palpitations, Rash and Other (See Comments)    Green peppers, onions and seafood    Review of Systems:  Constitutional: Denies fever, chills, diaphoresis, appetite change and fatigue.  HEENT: Denies photophobia, eye pain, redness, hearing loss, ear pain, congestion, sore throat, rhinorrhea, sneezing, mouth sores, neck pain, neck stiffness and tinnitus.   Respiratory: Denies SOB, DOE, cough, chest tightness,  and wheezing.   Cardiovascular: Denies chest pain, palpitations and leg swelling.  Genitourinary: Denies dysuria, urgency, frequency, hematuria, flank pain and difficulty urinating.  Musculoskeletal: Denies myalgias, back pain, joint swelling, arthralgias and gait problem.  Skin: No rash.  Neurological: Denies dizziness, seizures, syncope, weakness, light-headedness, numbness and headaches.  Hematological: Denies adenopathy. Easy bruising, personal or family bleeding history  Psychiatric/Behavioral: Has anxiety or depression.  Has insomnia.     Physical Exam:    BP 126/86    Pulse 62    Ht 5\' 9"  (1.753 m)    Wt 238 lb 4 oz (108.1 kg)    BMI 35.18 kg/m  Wt Readings from Last 3 Encounters:  06/19/21 238 lb 4 oz (108.1 kg)  04/01/15 190 lb 4.8 oz (86.3 kg)  05/12/14 195 lb 8.8 oz (88.7 kg)   Constitutional:  Well-developed, in no acute distress. Psychiatric: Normal mood and affect. Behavior is normal. HEENT: Pupils normal.  Conjunctivae are normal. No scleral icterus. Cardiovascular: Normal rate, regular rhythm. No edema Pulmonary/chest: Effort normal and breath sounds normal. No wheezing, rales or rhonchi. Abdominal: Soft, nondistended. Nontender. Bowel sounds active throughout. There are no masses palpable. No hepatomegaly. Rectal: He would like to get this performed at the time of colonoscopy. Neurological: Alert and oriented to person place and time. Skin: Skin is warm and  dry. No rashes noted.  Data Reviewed: I have personally reviewed following labs and imaging studies  CBC: CBC Latest Ref Rng & Units 05/08/2014 05/04/2014  WBC 4.0 - 10.5 K/uL - 14.9(H)  Hemoglobin 13.0 - 17.0 g/dL 11.9(L) 12.9(L)  Hematocrit 39.0 - 52.0 % 35.0(L) 39.7  Platelets 150 - 400 K/uL - 216    CMP: CMP Latest Ref Rng & Units 05/08/2014 05/04/2014  Glucose 70 - 99 mg/dL 99 90  BUN 6 - 23 mg/dL 14 12  Creatinine 0.50 - 1.35 mg/dL 0.90 1.05  Sodium 137 - 147 mEq/L 140 144  Potassium 3.7 - 5.3 mEq/L 4.3 3.6(L)  Chloride 96 - 112 mEq/L 103 106  CO2 19 - 32 mEq/L - 21  Calcium 8.4 - 10.5 mg/dL - 9.2  Carmell Austria, MD 06/19/2021, 9:11 AM  Cc: Kerin Perna, NP

## 2021-06-19 NOTE — Patient Instructions (Addendum)
If you are age 51 or older, your body mass index should be between 23-30. Your Body mass index is 35.18 kg/m. If this is out of the aforementioned range listed, please consider follow up with your Primary Care Provider.  If you are age 31 or younger, your body mass index should be between 19-25. Your Body mass index is 35.18 kg/m. If this is out of the aformentioned range listed, please consider follow up with your Primary Care Provider.   __________________________________________________________  The [facility] providers would like to encourage you to use [facility] to communicate with providers for non-urgent requests or questions.  Due to long hold times on the telephone, sending your provider a message by [facility] may be a faster and more efficient way to get a response.  Please allow 48 business hours for a response.  Please remember that this is for non-urgent requests.    Due to recent changes in healthcare laws, you may see the results of your imaging and laboratory studies on MyChart before your provider has had a chance to review them.  We understand that in some cases there may be results that are confusing or concerning to you. Not all laboratory results come back in the same time frame and the provider may be waiting for multiple results in order to interpret others.  Please give [place] 48 hours in order for your provider to thoroughly review all the results before contacting the office for clarification of your results.   We have sent the following medications to your pharmacy for you to pick up at your convenience:  Suprep for colonoscopy.  Thank you,   Dr. Chales Abrahams     Thank you,   Dr. Cammy Copa

## 2021-07-18 ENCOUNTER — Ambulatory Visit (INDEPENDENT_AMBULATORY_CARE_PROVIDER_SITE_OTHER): Payer: Medicaid Other | Admitting: Licensed Clinical Social Worker

## 2021-07-18 DIAGNOSIS — F3181 Bipolar II disorder: Secondary | ICD-10-CM

## 2021-07-18 DIAGNOSIS — F4310 Post-traumatic stress disorder, unspecified: Secondary | ICD-10-CM

## 2021-07-18 NOTE — Progress Notes (Signed)
° °  THERAPIST PROGRESS NOTE  Virtual Visit via Telephone Note  I connected with Dwan Bolt on 07/19/21 at  4:00 PM EST by telephone and verified that I am speaking with the correct person using two identifiers.  Location: Patient: [facility]  Provider: Providers Home    I discussed the limitations, risks, security and privacy concerns of performing an evaluation and management service by telephone and the availability of in person appointments. I also discussed with the patient that there may be a patient responsible charge related to this service. The patient expressed understanding and agreed to proceed.       I discussed the assessment and treatment plan with the patient. The patient was provided an opportunity to ask questions and all were answered. The patient agreed with the plan and demonstrated an understanding of the instructions.   The patient was advised to call back or seek an in-person evaluation if the symptoms worsen or if the condition fails to improve as anticipated.  I provided 33 minutes of non-face-to-face time during this encounter.   Dory Horn, LCSW  Participation Level: Active  Behavioral Response: CasualAlertAnxious and Depressed  Type of Therapy: Individual Therapy  Treatment Goals addressed: Anxiety and Coping  Interventions: CBT, Motivational Interviewing, and Supportive    Suicidal/Homicidal: Yeswithout intent/plan  Therapist Response:    Pt was alert and oriented x 5. He was not observed as pt session was conducted via phone. Prajit was pleasant, cooperative, and maintained good eye contact. He presented with flat and depress mood but engaged well in therapy session.   Primary stressor for pt is relationships. LCSW administer PHQ-9 and GAD-7 a 13 on both. He reports reoccurring thoughts for self-harm but does not have suicidal thoughts only wanting to go to sleep and not wake up. LCSW did contract pt for safety and pt was agreeable  to go to the nearest emergency room if self-harm became suicidal thoughts with plan or intent. Aidon states the increase in his depression and self-harming thoughts is due to poor relationship. He states that his friends take advantage of him. He used an example of homeless man that was an alcoholic.    Interventions/Plan: CBT, supportive therapy and person-centered therapy were used in today's session. LCSW used praise, encouragement, and empowerment. LCSW spoke with pt about boundary setting and surrounding himself with people that will help his depression not increase it with toxic traits. Plan is for pt to engage in [place] and do an intake in the next month.   Plan: Return again in 4 weeks.     Dory Horn, LCSW 07/18/2021

## 2021-07-31 ENCOUNTER — Telehealth (INDEPENDENT_AMBULATORY_CARE_PROVIDER_SITE_OTHER): Payer: Medicaid Other | Admitting: Psychiatry

## 2021-07-31 ENCOUNTER — Encounter (HOSPITAL_COMMUNITY): Payer: Self-pay | Admitting: Psychiatry

## 2021-07-31 DIAGNOSIS — F411 Generalized anxiety disorder: Secondary | ICD-10-CM | POA: Diagnosis not present

## 2021-07-31 DIAGNOSIS — F332 Major depressive disorder, recurrent severe without psychotic features: Secondary | ICD-10-CM | POA: Diagnosis not present

## 2021-07-31 DIAGNOSIS — G4700 Insomnia, unspecified: Secondary | ICD-10-CM

## 2021-07-31 DIAGNOSIS — F3181 Bipolar II disorder: Secondary | ICD-10-CM | POA: Diagnosis not present

## 2021-07-31 MED ORDER — LAMOTRIGINE 200 MG PO TABS: ORAL_TABLET | ORAL | 1 refills | Status: DC

## 2021-07-31 MED ORDER — ARIPIPRAZOLE 10 MG PO TABS: ORAL_TABLET | ORAL | 1 refills | Status: DC

## 2021-07-31 MED ORDER — HYDROXYZINE HCL 50 MG PO TABS: 50.0000 mg | ORAL_TABLET | Freq: Three times a day (TID) | ORAL | 1 refills | Status: DC | PRN

## 2021-07-31 MED ORDER — MIRTAZAPINE 45 MG PO TABS: 45.0000 mg | ORAL_TABLET | Freq: Every day | ORAL | 1 refills | Status: DC

## 2021-07-31 NOTE — Progress Notes (Signed)
[place] MD/PA/NP OP Progress Note Virtual Visit via Telephone Note  I connected with Louis [name] on 07/31/21 at  3:30 PM EST by telephone and verified that I am speaking with the correct person using two identifiers.  Location: Patient: home Provider: Clinic   I discussed the limitations, risks, security and privacy concerns of performing an evaluation and management service by telephone and the availability of in person appointments. I also discussed with the patient that there may be a patient responsible charge related to this service. The patient expressed understanding and agreed to proceed.   I provided 30 minutes of non-face-to-face time during this encounter.   07/31/2021 11:06 AM Louis [name]  MRN:  [id number] [facility]  Chief Complaint: "I have been crying more and have low self esteem"  HPI: 51 year old male seen today for follow up psychiatric evaluation.  He has a psychiatric history of PTSD, anxiety, bipolar 2 disorder, and depression. He is currently managed on mirtazapine 45 mg nightly, Lamictal 200 mg nightly, Abilify 5 mg daily, Ambien 5 mg nightly as needed, gabapentin 600 mg 3 times daily (received from his PCP) and hydroxyzine 50 mg three times daily as needed. He notes that his medications are somewhat effective in managing his psychiatric conditions.   Today he was unable to log in virtually so his exam was done over the phone.  During exam he was pleasant, cooperative, and engaged in conversation.  He informed Probation officer that he has been crying more and has low self esteem. He also notes that he becomes uncomfortable in social settings. He notes that he is worried about bills, his job at [employer], keeping his food stamps.  Today provider conducted a GAD7 and patient scored a 16, at his last visit he scored a 52. A PHQ 9 was conducted by patients counselor on 07/18/2021 and patient scored a 14, at his last visit he scored a 55. He endorses adequate sleep and  appetite.  He denies SI/HI/VAH or mania.  Patient notes at times he feels paranoid in social settings.  Today he denies delusional thinking however inform her that he prefers to stay away from computers as he feels like at times he is being watched and followed.    Patient also he is in pain most days.  He notes that his legs, neck, and back are painful.  He takes Percocet and gabapentin to help manage his pain.    Today Abilify 5 mg increased to 10 mg to help manage symptoms of depression and paranoia.  He will continue other medication as prescribed.  Also follow-up with outpatient counseling for therapy.   Visit Diagnosis:    ICD-10-CM   1. Bipolar 2 disorder (HCC)  F31.81 ARIPiprazole (ABILIFY) 10 MG tablet    lamoTRIgine (LAMICTAL) 200 MG tablet    2. Severe episode of recurrent major depressive disorder, without psychotic features (HCC)  F33.2 ARIPiprazole (ABILIFY) 10 MG tablet    mirtazapine (REMERON) 45 MG tablet    3. Generalized anxiety disorder  F41.1 hydrOXYzine (ATARAX) 50 MG tablet    mirtazapine (REMERON) 45 MG tablet    4. Insomnia due to other mental disorder  F51.05    F99     5. Insomnia  G47.00 mirtazapine (REMERON) 45 MG tablet      Past Psychiatric History: PTSD, anxiety, bipolar 2 disorder, and depression  Past Medical History:  Past Medical History:  Diagnosis Date   Anxiety    Back pain  Bipolar disorder ([place])    Chronic lower back pain 03/09/2014   For over 20 years- since serving in the [employer].  Lower and cervical pain.    Closed bicondylar fracture of proximal end of right tibia    Depression    Diarrhea    Headache    Hypercholesterolemia    Hypertension    Insomnia    Memory loss    due to attempted suicide by hanging   Migraine    Pain of right  arm 03/09/2014   Pneumonia    as a baby   Post traumatic stress disorder    Spondylosis     Past Surgical History:  Procedure Laterality Date   MULTIPLE TOOTH EXTRACTIONS     ORIF TIBIA  FRACTURE Left 04/2014   dr Mayer Camel   ORIF TIBIA PLATEAU Right 05/10/2014   Procedure: OPEN REDUCTION INTERNAL FIXATION (ORIF)RIGHT PROXIMAL TIBIA FRACTURE;  Surgeon: Kerin Salen, MD;  Location: [place] [facility];  Service: Orthopedics;  Laterality: Right;    Family Psychiatric History: Mother anxiety  Family History:  Family History  Problem Relation Age of Onset   Stroke Father    Colon cancer Father    Esophageal cancer Neg Hx     Social History:  Social History   Socioeconomic History   Marital status: Single    Spouse name: Not on file   Number of children: 1   Years of education: HS +   Highest education level: Not on file  Occupational History   Not on file  Tobacco Use   Smoking status: Never   Smokeless tobacco: Never  Vaping Use   Vaping Use: Never used  Substance and Sexual Activity   Alcohol use: No   Drug use: No   Sexual activity: Not on file  Other Topics Concern   Not on file  Social History Narrative   Patient is single and lives alone.   Patient has one child.   Patient has high school education and some college.   Patient is currently unemployed.   Patient is left-handed.   Patient drinks a 2 liter of soda on most days.         Social Determinants of Health   Financial Resource Strain: Not on file  Food Insecurity: Not on file  Transportation Needs: Not on file  Physical Activity: Not on file  Stress: Not on file  Social Connections: Not on file    Allergies:  Allergies  Allergen Reactions   Fish Allergy Other (See Comments)   Anthrax Vaccine     Turned green, memory loss   Iodine Other (See Comments)   Other Diarrhea, Itching, Palpitations, Rash and Other (See Comments)    Green peppers, onions and seafood    Metabolic Disorder Labs: No results found for: HGBA1C, MPG No results found for: PROLACTIN No results found for: CHOL, TRIG, HDL, CHOLHDL, VLDL, LDLCALC No results found for: TSH  Therapeutic Level Labs: No results found for:  LITHIUM No results found for: VALPROATE No components found for:  CBMZ  Current Medications: Current Outpatient Medications  Medication Sig Dispense Refill   ARIPiprazole (ABILIFY) 10 MG tablet TAKE 1 TABLET(5 MG) BY MOUTH DAILY 90 tablet 1   gabapentin (NEURONTIN) 600 MG tablet Take 1 tablet (600 mg total) by mouth 3 (three) times daily. 90 tablet 4   hydrOXYzine (ATARAX) 50 MG tablet Take 1 tablet (50 mg total) by mouth 3 (three) times daily as needed. 270 tablet 1   ibuprofen (  ADVIL,MOTRIN) 800 MG tablet Take 800 mg by mouth every 8 (eight) hours. As needed for back pain     lamoTRIgine (LAMICTAL) 200 MG tablet TAKE 1 TABLET(200 MG) BY MOUTH DAILY 90 tablet 1   mirtazapine (REMERON) 45 MG tablet Take 1 tablet (45 mg total) by mouth at bedtime. 90 tablet 1   oxyCODONE-acetaminophen (PERCOCET) 7.5-325 MG tablet Take 1 tablet by mouth every 6 (six) hours.     pravastatin (PRAVACHOL) 20 MG tablet Take 20 mg by mouth at bedtime.     SUPREP BOWEL PREP KIT 17.5-3.13-1.6 GM/177ML SOLN Take 1 kit by mouth as directed. For colonoscopy prep BIN: [id number] PCN: CN GROUP: [id number] ID: [id number] 354 mL 0   zolpidem (AMBIEN) 5 MG tablet TAKE 1 TABLET(5 MG) BY MOUTH AT BEDTIME AS NEEDED FOR SLEEP 15 tablet 2   No current facility-administered medications for this visit.     Musculoskeletal: Strength & Muscle Tone:  Unable to assess due to telephone visit Gait & Station:  Unable to assess due to telephone visit Patient leans: N/A  Psychiatric Specialty Exam: Review of Systems  There were no vitals taken for this visit.There is no height or weight on file to calculate BMI.  General Appearance:  Unable to assess due to telephone visit  Eye Contact:  Good  Speech:  Clear and Coherent and Normal Rate  Volume:  Normal  Mood:  Anxious and Depressed  Affect:   Unable to assess due to telephone visit  Thought Process:  Coherent, Goal Directed, and Linear  Orientation:  Full (Time, Place, and Person)   Thought Content: Logical and Paranoid Ideation   Suicidal Thoughts:  No  Homicidal Thoughts:  No  Memory:  Immediate;   Good Recent;   Good Remote;   Good  Judgement:  Good  Insight:  Good  Psychomotor Activity:   Unable to assess due to telephone visit  Concentration:  Concentration: Good and Attention Span: Good  Recall:  Good  Fund of Knowledge: Good  Language: Good  Akathisia:  No  Handed:  Left  AIMS (if indicated):Not done  Assets:  Communication Skills Desire for Improvement Financial Resources/Insurance Housing Social Support  ADL's:  Intact  Cognition: WNL  Sleep:  Good   Screenings: GAD-7    Flowsheet Row Video Visit from 07/31/2021 in [facility] Counselor from 06/17/2021 in [facility] Counselor from 05/07/2021 in [facility] Video Visit from 02/05/2021 in [facility] Video Visit from 11/06/2020 in [facility]  Total GAD-7 Score '16 13 14 15 14      ' PHQ2-9    Flowsheet Row Counselor from 07/18/2021 in [facility] Counselor from 06/17/2021 in [facility] Counselor from 05/07/2021 in [facility] Counselor from 03/14/2021 in [facility] Video Visit from 02/05/2021 in [place]  PHQ-2 Total Score '6 6 5 6 5  ' PHQ-9 Total Score '14 13 17 17 16      ' Flowsheet Row Counselor from 07/18/2021 in [facility] Counselor from 06/17/2021 in [facility] Counselor from 03/14/2021 in [place] and Plan: Patient presents with paranoia and increased depression. Today Abilify 5 mg increased to 10 mg to help manage symptoms of depression  and paranoia.   He will continue other medication as prescribed.  Patient request 90-day supply of medications.  Provider was agreeable to this request.  1. Bipolar 2 disorder (HCC)  Increase- ARIPiprazole (ABILIFY) 10 MG tablet; TAKE 1 TABLET(5 MG) BY MOUTH DAILY  Dispense: 90 tablet; Refill: 1 Continue- lamoTRIgine (LAMICTAL) 200 MG tablet; TAKE 1 TABLET(200 MG) BY MOUTH DAILY  Dispense: 90 tablet; Refill: 1  2. Severe episode of recurrent major depressive disorder, without psychotic features (HCC)  Increase- ARIPiprazole (ABILIFY) 10 MG tablet; TAKE 1 TABLET(5 MG) BY MOUTH DAILY  Dispense: 90 tablet; Refill: 1 Continue- mirtazapine (REMERON) 45 MG tablet; Take 1 tablet (45 mg total) by mouth at bedtime.  Dispense: 90 tablet; Refill: 1  3. Generalized anxiety disorder  Continue- hydrOXYzine (ATARAX) 50 MG tablet; Take 1 tablet (50 mg total) by mouth 3 (three) times daily as needed.  Dispense: 270 tablet; Refill: 1 Continue- mirtazapine (REMERON) 45 MG tablet; Take 1 tablet (45 mg total) by mouth at bedtime.  Dispense: 90 tablet; Refill: 1  4. Insomnia  Continue- mirtazapine (REMERON) 45 MG tablet; Take 1 tablet (45 mg total) by mouth at bedtime.  Dispense: 90 tablet; Refill: 1   Follow-up in 3 months Follow-up with therapy  Salley Slaughter, NP 07/31/2021, 11:06 AM

## 2021-08-08 ENCOUNTER — Ambulatory Visit (INDEPENDENT_AMBULATORY_CARE_PROVIDER_SITE_OTHER): Payer: Medicaid Other | Admitting: Licensed Clinical Social Worker

## 2021-08-08 DIAGNOSIS — F4310 Post-traumatic stress disorder, unspecified: Secondary | ICD-10-CM | POA: Diagnosis not present

## 2021-08-08 DIAGNOSIS — F3181 Bipolar II disorder: Secondary | ICD-10-CM
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
## 2021-08-14 ENCOUNTER — Ambulatory Visit (AMBULATORY_SURGERY_CENTER): Payer: Medicaid Other | Admitting: Gastroenterology

## 2021-08-14 ENCOUNTER — Other Ambulatory Visit: Payer: Self-pay

## 2021-08-14 ENCOUNTER — Encounter: Payer: Self-pay | Admitting: Gastroenterology

## 2021-08-14 VITALS — BP 123/82 | HR 65 | Temp 99.3°F | Resp 20 | Ht 69.0 in | Wt 238.0 lb

## 2021-08-14 DIAGNOSIS — Z1211 Encounter for screening for malignant neoplasm of colon: Secondary | ICD-10-CM | POA: Diagnosis not present

## 2021-08-14 DIAGNOSIS — Z800 Family history of malignant neoplasm of digestive organs: Secondary | ICD-10-CM | POA: Diagnosis not present

## 2021-08-14 MED ORDER — SODIUM CHLORIDE 0.9 % IV SOLN: 500.0000 mL | Freq: Once | INTRAVENOUS | Status: DC

## 2021-08-14 NOTE — Progress Notes (Signed)
Chief Complaint: For colonoscopy  Referring Provider:  Grayce SessionsEdwards, Michelle P, NP      ASSESSMENT AND PLAN;   #1. FH colon Ca (dad age 188)  #2.  Intermittent rectal bleeding attributed to longstanding Hoids   Plan: -Colon for further eval. -Use Preparation H on as needed basis. -[facility].  If unable to, check CBC and CMP at the time of colonoscopy.   Discussed risks & benefits of colonoscopy. Risks including rare perforation req laparotomy, bleeding after bx/polypectomy req blood transfusion, rarely missing neoplasms, risks of anesthesia/sedation, rare risk of damage to internal organs. Benefits outweigh the risks. Patient agrees to proceed. All the questions were answered. Pt consents to proceed.  HPI:    Louis StallionKirby L [name] is a 51 y.o. male  With anxiety/depression/PTSD, HTN, HLD, LBP on pain meds Seen in GI clinic for colonoscopy.  With family history of colon cancer-dad at age 52  He never had colonoscopy.  Occ rectal bleeding x 30 +yrs, attributed to hemorrhoids.  He was "supposed to have hemorrhoidectomy" several years ago but he did not.  Denies having any constipation.  No perirectal pain.  No abdominal pain.  Occ diarrhea-normally has 2-3 BMs per day especially after eating.  Does not want blood work today as he has annual blood work performed on clinic at [facility]. [address]  We will obtain records  Denies having any upper GI symptoms including nausea, vomiting, heartburn, regurgitation, odynophagia or dysphagia.  No weight loss.   Past Medical History:  Diagnosis Date   Anxiety    Back pain    Bipolar disorder (HCC)    Chronic lower back pain 03/09/2014   For over 20 years- since serving in the [employer].  Lower and cervical pain.    Closed bicondylar fracture of proximal end of right tibia    Depression    Diarrhea    Headache    Hypercholesterolemia    Hypertension    Insomnia    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   Migraine    Pain of right  arm 03/09/2014   Pneumonia    as a baby   Post traumatic stress disorder    Spondylosis     Past Surgical History:  Procedure Laterality Date   MULTIPLE TOOTH EXTRACTIONS     ORIF TIBIA FRACTURE Left 04/2014   dr Turner Danielsrowan   ORIF TIBIA PLATEAU Right 05/10/2014   Procedure: OPEN REDUCTION INTERNAL FIXATION (ORIF)RIGHT PROXIMAL TIBIA FRACTURE;  Surgeon: Nestor LewandowskyFrank J Rowan, MD;  Location: [facility] OR;  Service: Orthopedics;  Laterality: Right;    Family History  Problem Relation Age of Onset   Stroke Father    Colon cancer Father    Esophageal cancer Neg Hx    Stomach cancer Neg Hx    Rectal cancer Neg Hx     Social History   Tobacco Use   Smoking status: Never   Smokeless tobacco: Never  Vaping Use   Vaping Use: Never used  Substance Use Topics   Alcohol use: No   Drug use: No    Current Outpatient Medications  Medication Sig Dispense Refill   ARIPiprazole (ABILIFY) 10 MG tablet TAKE 1 TABLET(5 MG) BY MOUTH DAILY 90 tablet 1   gabapentin (NEURONTIN) 600 MG tablet Take 1 tablet (600 mg total) by mouth 3 (three) times daily. 90 tablet 4   hydrOXYzine (ATARAX) 50 MG tablet Take  1 tablet (50 mg total) by mouth 3 (three) times daily as needed. 270 tablet 1   ibuprofen (ADVIL,MOTRIN) 800 MG tablet Take 800 mg by mouth every 8 (eight) hours. As needed for back pain     lamoTRIgine (LAMICTAL) 200 MG tablet TAKE 1 TABLET(200 MG) BY MOUTH DAILY 90 tablet 1   mirtazapine (REMERON) 45 MG tablet Take 1 tablet (45 mg total) by mouth at bedtime. 90 tablet 1   oxyCODONE-acetaminophen (PERCOCET) 7.5-325 MG tablet Take 1 tablet by mouth every 6 (six) hours.     pravastatin (PRAVACHOL) 20 MG tablet Take 20 mg by mouth at bedtime.     zolpidem (AMBIEN) 5 MG tablet TAKE 1 TABLET(5 MG) BY MOUTH AT BEDTIME AS NEEDED FOR SLEEP 15 tablet 2   Current Facility-Administered Medications  Medication Dose Route Frequency Provider Last Rate Last  Admin   0.9 %  sodium chloride infusion  500 mL Intravenous Once Jackquline Denmark, MD        Allergies  Allergen Reactions   Fish Allergy Other (See Comments)   Anthrax Vaccine     Turned green, memory loss   Iodine Other (See Comments)   Other Diarrhea, Itching, Palpitations, Rash and Other (See Comments)    Green peppers, onions and seafood    Review of Systems:  Constitutional: Denies fever, chills, diaphoresis, appetite change and fatigue.  HEENT: Denies photophobia, eye pain, redness, hearing loss, ear pain, congestion, sore throat, rhinorrhea, sneezing, mouth sores, neck pain, neck stiffness and tinnitus.   Respiratory: Denies SOB, DOE, cough, chest tightness,  and wheezing.   Cardiovascular: Denies chest pain, palpitations and leg swelling.  Genitourinary: Denies dysuria, urgency, frequency, hematuria, flank pain and difficulty urinating.  Musculoskeletal: Denies myalgias, back pain, joint swelling, arthralgias and gait problem.  Skin: No rash.  Neurological: Denies dizziness, seizures, syncope, weakness, light-headedness, numbness and headaches.  Hematological: Denies adenopathy. Easy bruising, personal or family bleeding history  Psychiatric/Behavioral: Has anxiety or depression.  Has insomnia.     Physical Exam:    BP 112/65    Pulse 72    Temp 99.3 F (37.4 C) (Temporal)    Resp (!) 21    Ht 5\' 9"  (1.753 m)    Wt 238 lb (108 kg)    SpO2 97%    BMI 35.15 kg/m  Wt Readings from Last 3 Encounters:  08/14/21 238 lb (108 kg)  06/19/21 238 lb 4 oz (108.1 kg)  04/01/15 190 lb 4.8 oz (86.3 kg)   Constitutional:  Well-developed, in no acute distress. Psychiatric: Normal mood and affect. Behavior is normal. HEENT: Pupils normal.  Conjunctivae are normal. No scleral icterus. Cardiovascular: Normal rate, regular rhythm. No edema Pulmonary/chest: Effort normal and breath sounds normal. No wheezing, rales or rhonchi. Abdominal: Soft, nondistended. Nontender. Bowel sounds active  throughout. There are no masses palpable. No hepatomegaly. Rectal: He would like to get this performed at the time of colonoscopy. Neurological: Alert and oriented to person place and time. Skin: Skin is warm and dry. No rashes noted.  Data Reviewed: I have personally reviewed following labs and imaging studies  CBC: CBC Latest Ref Rng & Units 05/08/2014 05/04/2014  WBC 4.0 - 10.5 K/uL - 14.9(H)  Hemoglobin 13.0 - 17.0 g/dL 11.9(L) 12.9(L)  Hematocrit 39.0 - 52.0 % 35.0(L) 39.7  Platelets 150 - 400 K/uL - 216    CMP: CMP Latest Ref Rng & Units 05/08/2014 05/04/2014  Glucose 70 - 99 mg/dL 99 90  BUN 6 - 23 mg/dL  14 12  Creatinine 0.50 - 1.35 mg/dL 0.90 1.05  Sodium 137 - 147 mEq/L 140 144  Potassium 3.7 - 5.3 mEq/L 4.3 3.6(L)  Chloride 96 - 112 mEq/L 103 106  CO2 19 - 32 mEq/L - 21  Calcium 8.4 - 10.5 mg/dL - 9.2       Carmell Austria, MD 08/14/2021, 1:41 PM  Cc: Kerin Perna, NP

## 2021-08-14 NOTE — Patient Instructions (Signed)
Discharge instructions given. °Handouts on Diverticulosis and Hemorrhoids. °Resume previous medications. °YOU HAD AN ENDOSCOPIC PROCEDURE TODAY AT THE [facility]:   Refer to the procedure report that was given to you for any specific questions about what was found during the examination.  If the procedure report does not answer your questions, please call your gastroenterologist to clarify.  If you requested that your care partner not be given the details of your procedure findings, then the procedure report has been included in a sealed envelope for you to review at your convenience later. ° °YOU SHOULD EXPECT: Some feelings of bloating in the abdomen. Passage of more gas than usual.  Walking can help get rid of the air that was put into your GI tract during the procedure and reduce the bloating. If you had a lower endoscopy (such as a colonoscopy or flexible sigmoidoscopy) you may notice spotting of blood in your stool or on the toilet paper. If you underwent a bowel prep for your procedure, you may not have a normal bowel movement for a few days. ° °Please Note:  You might notice some irritation and congestion in your nose or some drainage.  This is from the oxygen used during your procedure.  There is no need for concern and it should clear up in a day or so. ° °SYMPTOMS TO REPORT IMMEDIATELY: ° °Following lower endoscopy (colonoscopy or flexible sigmoidoscopy): ° Excessive amounts of blood in the stool ° Significant tenderness or worsening of abdominal pains ° Swelling of the abdomen that is new, acute ° Fever of 100°F or higher ° ° °For urgent or emergent issues, a gastroenterologist can be reached at any hour by calling [phone]. °Do not use MyChart messaging for urgent concerns.  ° ° °DIET:  We do recommend a small meal at first, but then you may proceed to your regular diet.  Drink plenty of fluids but you should avoid alcoholic beverages for 24 hours. ° °ACTIVITY:  You should plan to  take it easy for the rest of today and you should NOT DRIVE or use heavy machinery until tomorrow (because of the sedation medicines used during the test).   ° °FOLLOW UP: °Our staff will call the number listed on your records 48-72 hours following your procedure to check on you and address any questions or concerns that you may have regarding the information given to you following your procedure. If we do not reach you, we will leave a message.  We will attempt to reach you two times.  During this call, we will ask if you have developed any symptoms of COVID 19. If you develop any symptoms (ie: fever, flu-like symptoms, shortness of breath, cough etc.) before then, please call [phone].  If you test positive for Covid 19 in the 2 weeks post procedure, please call and report this information to us.   ° °If any biopsies were taken you will be contacted by phone or by letter within the next 1-3 weeks.  Please call us at [phone] if you have not heard about the biopsies in 3 weeks.  ° ° °SIGNATURES/CONFIDENTIALITY: °You and/or your care partner have signed paperwork which will be entered into your electronic medical record.  These signatures attest to the fact that that the information above on your After Visit Summary has been reviewed and is understood.  Full responsibility of the confidentiality of this discharge information lies with you and/or your care-partner.  °

## 2021-08-14 NOTE — Op Note (Signed)
[facility] Patient Name: Louis [name] Procedure Date: 08/14/2021 1:28 PM MRN: [id number] Endoscopist: Lynann Bologna , MD Age: 51 Referring MD:  Date of Birth: Oct 30, 1970 Gender: Male Account #: [id number] Procedure:                Colonoscopy Indications:              Screening in patient at increased risk: FH of colon                            cancer Medicines:                Monitored Anesthesia Care Procedure:                Pre-Anesthesia Assessment:                           - Prior to the procedure, a History and Physical                            was performed, and patient medications and                            allergies were reviewed. The patient's tolerance of                            previous anesthesia was also reviewed. The risks                            and benefits of the procedure and the sedation                            options and risks were discussed with the patient.                            All questions were answered, and informed consent                            was obtained. Prior Anticoagulants: The patient has                            taken no previous anticoagulant or antiplatelet                            agents. ASA Grade Assessment: II - A patient with                            mild systemic disease. After reviewing the risks                            and benefits, the patient was deemed in                            satisfactory condition to undergo the procedure.  After obtaining informed consent, the colonoscope                            was passed under direct vision. Throughout the                            procedure, the patient's blood pressure, pulse, and                            oxygen saturations were monitored continuously. The                            Olympus CF-HQ190L [id number]) Colonoscope was                            introduced through the anus and advanced to the 2                             cm into the ileum. The colonoscopy was performed                            without difficulty. The patient tolerated the                            procedure well. The quality of the bowel                            preparation was good. The ileocecal valve,                            appendiceal orifice, and rectum were photographed. Scope In: 1:45:40 PM Scope Out: 1:59:58 PM Scope Withdrawal Time: 0 hours 9 minutes 0 seconds  Total Procedure Duration: 0 hours 14 minutes 18 seconds  Findings:                 A few small-mouthed diverticula were found in the                            sigmoid colon.                           Non-bleeding internal hemorrhoids were found during                            retroflexion. The hemorrhoids were small and Grade                            I (internal hemorrhoids that do not prolapse).                           The exam was otherwise without abnormality on                            direct and retroflexion views. Healed posterior  anal fissure was noted on the rectal exam. Complications:            No immediate complications. Estimated Blood Loss:     Estimated blood loss: none. Impression:               - Minimal sigmoid diverticulosis                           - Non-bleeding internal hemorrhoids.                           - The examination was otherwise normal on direct                            and retroflexion views.                           - No specimens collected. Recommendation:           - Patient has a contact number available for                            emergencies. The signs and symptoms of potential                            delayed complications were discussed with the                            patient. Return to normal activities tomorrow.                            Written discharge instructions were provided to the                            patient.                           -  Resume previous diet.                           - Continue present medications.                           - Repeat colonoscopy in 5 years for screening                            purposes. Earlier, if with any new problems or                            change in family history.                           - The findings and recommendations were discussed                            with the patient's family. Lynann Bologna, MD 08/14/2021 2:06:31 PM This report has been signed electronically.

## 2021-08-14 NOTE — Progress Notes (Signed)
PT taken to PACU. Monitors in place. VSS. Report given to RN. 

## 2021-08-14 NOTE — Progress Notes (Signed)
Pt's states no medical or surgical changes since previsit or office visit. 

## 2021-08-16 ENCOUNTER — Telehealth: Payer: Self-pay

## 2021-08-16 NOTE — Telephone Encounter (Signed)
°  Follow up Call-  Call back number 08/14/2021  Post procedure Call Back phone  # [phone]  Permission to leave phone message No  Some recent data might be hidden     Patient questions:  Do you have a fever, pain , or abdominal swelling? No. Pain Score  0 *  Have you tolerated food without any problems? Yes.    Have you been able to return to your normal activities? Yes.    Do you have any questions about your discharge instructions: Diet   No. Medications  No. Follow up visit  No.  Do you have questions or concerns about your Care? No.  Actions: * If pain score is 4 or above: No action needed, pain <4.

## 2021-08-27 ENCOUNTER — Ambulatory Visit (INDEPENDENT_AMBULATORY_CARE_PROVIDER_SITE_OTHER): Payer: Medicaid Other | Admitting: Licensed Clinical Social Worker

## 2021-08-27 DIAGNOSIS — F4310 Post-traumatic stress disorder, unspecified: Secondary | ICD-10-CM

## 2021-08-27 DIAGNOSIS — F332 Major depressive disorder, recurrent severe without psychotic features: Secondary | ICD-10-CM | POA: Diagnosis not present

## 2021-08-27 DIAGNOSIS — F3181 Bipolar II disorder: Secondary | ICD-10-CM

## 2021-08-27 DIAGNOSIS — F411 Generalized anxiety disorder: Secondary | ICD-10-CM
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
## 2021-08-27 NOTE — Plan of Care (Signed)
Pt updated on plan. Treatment plan updated for new format through epic.

## 2021-09-26 ENCOUNTER — Ambulatory Visit (HOSPITAL_COMMUNITY): Payer: Medicaid Other | Admitting: Licensed Clinical Social Worker

## 2021-09-26 ENCOUNTER — Encounter (HOSPITAL_COMMUNITY): Payer: Self-pay

## 2021-09-26 DIAGNOSIS — F4310 Post-traumatic stress disorder, unspecified: Secondary | ICD-10-CM

## 2021-09-26 DIAGNOSIS — F3181 Bipolar II disorder: Secondary | ICD-10-CM

## 2021-09-26 DIAGNOSIS — F332 Major depressive disorder, recurrent severe without psychotic features: Secondary | ICD-10-CM
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
## 2021-09-26 NOTE — Plan of Care (Signed)
?  Problem: Anxiety Disorder CCP Problem  1 GAD  ?Goal: Walk 2 x weekly  ?Outcome: Progressing ?Goal: Attend 2 group therapy per month.  ?Outcome: Not Progressing ?Goal: LTG: Patient will score less than 5 on the Generalized Anxiety Disorder 7 Scale (GAD-7) ?Outcome: Progressing ?  ?Problem: Depression CCP Problem  1 MDD  ?Goal: Decrease SI to 1 x monthly with no plan or intent.  ?Outcome: Not Progressing ?Goal: LTG: Kalyn WILL SCORE LESS THAN 10 ON THE PATIENT HEALTH QUESTIONNAIRE (PHQ-9) ?Outcome: Not Progressing ?Goal: STG: Cornel WILL COMPLETE AT LEAST 80% OF ASSIGNED HOMEWORK ?Outcome: Progressing ?  ?Problem: PTSD-Trauma Disorder CCP Problem  1 PTSD  ?Goal: Decrease flash backs and nightmare to 1 x weekly  ?Outcome: Progressing ?  ?

## 2021-10-01 ENCOUNTER — Other Ambulatory Visit (HOSPITAL_COMMUNITY): Payer: Self-pay | Admitting: Psychiatry

## 2021-10-01 DIAGNOSIS — F5105 Insomnia due to other mental disorder: Secondary | ICD-10-CM

## 2021-10-07 ENCOUNTER — Telehealth (HOSPITAL_COMMUNITY): Payer: Self-pay | Admitting: *Deleted

## 2021-10-07 ENCOUNTER — Other Ambulatory Visit (HOSPITAL_COMMUNITY): Payer: Self-pay | Admitting: Psychiatry

## 2021-10-07 NOTE — Telephone Encounter (Signed)
Pharmacy fax request for patients ambien. Pharmacy reports last filled on 08/22/21 and amount on rx is only for 15 pills though he can take it QHS prn. He should have been out for a month. He has a next appt on 4/18 with the new provider that is covering for Brittney while she is out on leave. I will send this request to [facility] NP for her consideration.  ?

## 2021-10-07 NOTE — Telephone Encounter (Signed)
Ambien 5 mg at bedtime as needed, total 15 tab with no refill sent to [facility], [place] ?

## 2021-10-15 ENCOUNTER — Telehealth (HOSPITAL_COMMUNITY): Payer: Medicaid Other | Admitting: Psychiatry

## 2021-10-24 ENCOUNTER — Ambulatory Visit (HOSPITAL_COMMUNITY): Payer: Medicaid Other | Admitting: Licensed Clinical Social Worker

## 2021-10-24 DIAGNOSIS — F4310 Post-traumatic stress disorder, unspecified: Secondary | ICD-10-CM

## 2021-10-24 DIAGNOSIS — F332 Major depressive disorder, recurrent severe without psychotic features: Secondary | ICD-10-CM

## 2021-10-24 NOTE — Plan of Care (Signed)
?  Problem: Anxiety Disorder CCP Problem  1 GAD  ?Goal: Walk 2 x weekly  ?Outcome: Progressing ?Goal: Attend 2 group therapy per month.  ?Outcome: Not Progressing ?Goal: LTG: Patient will score less than 5 on the Generalized Anxiety Disorder 7 Scale (GAD-7) ?Outcome: Not Progressing ?  ?Problem: Depression CCP Problem  1 MDD  ?Goal: Decrease SI to 1 x monthly with no plan or intent.  ?Outcome: Not Progressing ?Goal: LTG: Macai WILL SCORE LESS THAN 10 ON THE PATIENT HEALTH QUESTIONNAIRE (PHQ-9) ?Outcome: Not Progressing ?Goal: STG: Leyland WILL COMPLETE AT LEAST 80% OF ASSIGNED HOMEWORK ?Outcome: Progressing ?  ?Problem: PTSD-Trauma Disorder CCP Problem  1 PTSD  ?Goal: Decrease flash backs and nightmare to 1 x weekly  ?Outcome: Progressing ?Intervention: Philis Fendt TO PRACTICE BREATHING RETRAINING FOR 10 MINUTES, 3 TIMES PER DAY ?  ?
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
## 2021-11-12 ENCOUNTER — Ambulatory Visit (INDEPENDENT_AMBULATORY_CARE_PROVIDER_SITE_OTHER): Payer: Medicaid Other | Admitting: Psychiatry

## 2021-11-12 DIAGNOSIS — F3181 Bipolar II disorder: Secondary | ICD-10-CM

## 2021-11-12 DIAGNOSIS — F332 Major depressive disorder, recurrent severe without psychotic features: Secondary | ICD-10-CM | POA: Diagnosis not present

## 2021-11-12 DIAGNOSIS — F5105 Insomnia due to other mental disorder: Secondary | ICD-10-CM

## 2021-11-12 DIAGNOSIS — G4700 Insomnia, unspecified: Secondary | ICD-10-CM

## 2021-11-12 DIAGNOSIS — F99 Mental disorder, not otherwise specified: Secondary | ICD-10-CM

## 2021-11-12 DIAGNOSIS — F411 Generalized anxiety disorder: Secondary | ICD-10-CM

## 2021-11-12 MED ORDER — ZOLPIDEM TARTRATE 5 MG PO TABS: ORAL_TABLET | ORAL | 0 refills | Status: DC

## 2021-11-12 NOTE — Progress Notes (Signed)
BH MD/PA/NP OP Progress Note ? ?11/12/2021 5:09 PM ?Louis [name]  ?MRN:  [id number] ? ?Virtual Visit via Telephone Note ? ?I connected with Louis [name] on 11/12/21 at  4:00 PM EDT by telephone and verified that I am speaking with the correct person using two identifiers. ? ?Location: ?Patient: home ?Provider: offsite ?  ?I discussed the limitations, risks, security and privacy concerns of performing an evaluation and management service by telephone and the availability of in person appointments. I also discussed with the patient that there may be a patient responsible charge related to this service. The patient expressed understanding and agreed to proceed. ? ?  ?I discussed the assessment and treatment plan with the patient. The patient was provided an opportunity to ask questions and all were answered. The patient agreed with the plan and demonstrated an understanding of the instructions. ?  ?The patient was advised to call back or seek an in-person evaluation if the symptoms worsen or if the condition fails to improve as anticipated. ? ?I provided 10 minutes of non-face-to-face time during this encounter. ? ? ?Mcneil Sober, NP  ? ?Chief Complaint: medication manageem ? ?HPI: Louis [name] is a 50 year old male presenting to [facility] behavioral health outpatient for follow-up psychiatric evaluation.  Patient is being seen by this provider as his attending psychiatric provider is unavailable to complete today's appointment.  Patient has a psychiatric history of bipolar 2 disorder, generalized anxiety disorder, major depressive disorder, PTSD and insomnia.  Patient symptoms are managed with Abilify 10 mg daily, hydroxyzine 50 mg 3 times daily as needed for anxiety, Lamictal 200 mg daily, mirtazapine 45 mg daily at bedtime and Ambien 5 mg at bedtime as needed for sleep.  Patient reports medication compliance and effectiveness.  He denies adverse medication effects or need for dosage adjustment today, despite  having paranoia.  Patient states that he has always been paranoid thinking everyone is out to get him.  Patient prefers not to adjust dosages of medication today.  No medication changes today. ? ? ? ?Visit Diagnosis:  ?  ICD-10-CM   ?1. Bipolar 2 disorder (HCC)  F31.81   ?  ?2. Severe episode of recurrent major depressive disorder, without psychotic features (HCC)  F33.2   ?  ?3. Generalized anxiety disorder  F41.1   ?  ?4. Insomnia  G47.00   ?  ? ? ?Past Psychiatric History: bipolar 2 disorder, generalized anxiety disorder, major depressive disorder, PTSD and insomnia ? ?Past Medical History:  ?Past Medical History:  ?Diagnosis Date  ? Anxiety   ? Back pain   ? Bipolar disorder (HCC)   ? Chronic lower back pain 03/09/2014  ? For over 20 years- since serving in the [employer].  Lower and cervical pain.   ? Closed bicondylar fracture of proximal end of right tibia   ? Depression   ? Diarrhea   ? Headache   ? Hypercholesterolemia   ? Hypertension   ? Insomnia   ? Memory loss   ? due to attempted suicide by hanging  ? Migraine   ? Pain of right  arm 03/09/2014  ? Pneumonia   ? as a baby  ? Post traumatic stress disorder   ? Spondylosis   ?  ?Past Surgical History:  ?Procedure Laterality Date  ? MULTIPLE TOOTH EXTRACTIONS    ? ORIF TIBIA FRACTURE Left 04/2014  ? dr Turner Daniels  ? ORIF TIBIA PLATEAU Right 05/10/2014  ? Procedure: OPEN REDUCTION INTERNAL FIXATION (ORIF)RIGHT PROXIMAL TIBIA  FRACTURE;  Surgeon: Nestor LewandowskyFrank J Rowan, MD;  [facility];  Service: Orthopedics;  Laterality: Right;  ? ? ?Family Psychiatric History: n/a ? ?Family History:  ?Family History  ?Problem Relation Age of Onset  ? Stroke Father   ? Colon cancer Father   ? Esophageal cancer Neg Hx   ? Stomach cancer Neg Hx   ? Rectal cancer Neg Hx   ? ? ?Social History:  ?Social History  ? ?Socioeconomic History  ? Marital status: Single  ?  Spouse name: Not on file  ? Number of children: 1  ? Years of education: HS +  ? Highest education level: Not on file   ?Occupational History  ? Not on file  ?Tobacco Use  ? Smoking status: Never  ? Smokeless tobacco: Never  ?Vaping Use  ? Vaping Use: Never used  ?Substance and Sexual Activity  ? Alcohol use: No  ? Drug use: No  ? Sexual activity: Not on file  ?Other Topics Concern  ? Not on file  ?Social History Narrative  ? Patient is single and lives alone.  ? Patient has one child.  ? Patient has high school education and some college.  ? Patient is currently unemployed.  ? Patient is left-handed.  ? Patient drinks a 2 liter of soda on most days.  ?   ?   ? ?Social Determinants of Health  ? ?Financial Resource Strain: Not on file  ?Food Insecurity: Not on file  ?Transportation Needs: Not on file  ?Physical Activity: Not on file  ?Stress: Not on file  ?Social Connections: Not on file  ? ? ?Allergies:  ?Allergies  ?Allergen Reactions  ? Fish Allergy Other (See Comments)  ? Anthrax Vaccine   ?  Turned green, memory loss  ? Iodine Other (See Comments)  ? Other Diarrhea, Itching, Palpitations, Rash and Other (See Comments)  ?  Green peppers, onions and seafood  ? ? ?Metabolic Disorder Labs: ?No results found for: HGBA1C, MPG ?No results found for: PROLACTIN ?No results found for: CHOL, TRIG, HDL, CHOLHDL, VLDL, LDLCALC ?No results found for: TSH ? ?Therapeutic Level Labs: ?No results found for: LITHIUM ?No results found for: VALPROATE ?No components found for:  CBMZ ? ?Current Medications: ?Current Outpatient Medications  ?Medication Sig Dispense Refill  ? zolpidem (AMBIEN) 5 MG tablet TAKE 1 TABLET(5 MG) BY MOUTH AT BEDTIME AS NEEDED FOR SLEEP 15 tablet 0  ? ARIPiprazole (ABILIFY) 10 MG tablet TAKE 1 TABLET(5 MG) BY MOUTH DAILY 90 tablet 1  ? gabapentin (NEURONTIN) 600 MG tablet Take 1 tablet (600 mg total) by mouth 3 (three) times daily. 90 tablet 4  ? hydrOXYzine (ATARAX) 50 MG tablet Take 1 tablet (50 mg total) by mouth 3 (three) times daily as needed. 270 tablet 1  ? ibuprofen (ADVIL,MOTRIN) 800 MG tablet Take 800 mg by mouth  every 8 (eight) hours. As needed for back pain    ? lamoTRIgine (LAMICTAL) 200 MG tablet TAKE 1 TABLET(200 MG) BY MOUTH DAILY 90 tablet 1  ? mirtazapine (REMERON) 45 MG tablet Take 1 tablet (45 mg total) by mouth at bedtime. 90 tablet 1  ? oxyCODONE-acetaminophen (PERCOCET) 7.5-325 MG tablet Take 1 tablet by mouth every 6 (six) hours.    ? pravastatin (PRAVACHOL) 20 MG tablet Take 20 mg by mouth at bedtime.    ? ?No current facility-administered medications for this visit.  ? ? ? ?Musculoskeletal: ?Strength & Muscle Tone: n/a virtual visit ?Gait & Station: n/a ?Patient leans: N/A ? ?  Psychiatric Specialty Exam: ?Review of Systems  ?Psychiatric/Behavioral:  Negative for hallucinations, self-injury and suicidal ideas.   ?All other systems reviewed and are negative.  ?There were no vitals taken for this visit.There is no height or weight on file to calculate BMI.  ?General Appearance: NA  ?Eye Contact:  NA  ?Speech:  Clear and Coherent  ?Volume:  Normal  ?Mood:  Euthymic  ?Affect:  NA  ?Thought Process:  Goal Directed  ?Orientation:  Full (Time, Place, and Person)  ?Thought Content: Logical   ?Suicidal Thoughts:  No  ?Homicidal Thoughts:  No  ?Memory: Good  ?Judgement:  Good  ?Insight:  Good  ?Psychomotor Activity:  NA  ?Concentration: Good  ?Recall:  Good  ?Fund of Knowledge: Good  ?Language: Good  ?Akathisia:  NA  ?Handed:  Right  ?AIMS (if indicated): done  ?Assets:  Communication Skills ?Desire for Improvement  ?ADL's:  Intact  ?Cognition: WNL  ?Sleep:  Good  ? ?Screenings: ?GAD-7   ? ?Flowsheet Row Counselor from 10/24/2021 in [facility] Counselor from 09/26/2021 in [facility] Counselor from 08/27/2021 in [facility] Video Visit from 07/31/2021 in [facility] Counselor from 06/17/2021 in [facility]  ?Total GAD-7 Score 12 12 12 16 13   ? ?  ? ?PHQ2-9   ? ? from 10/24/2021 in  [facility] Counselor from 09/26/2021 in [facility] Counselor from 08/27/2021 in [facility] Counselor from 1

## 2021-11-21 ENCOUNTER — Ambulatory Visit (INDEPENDENT_AMBULATORY_CARE_PROVIDER_SITE_OTHER): Payer: Medicaid Other | Admitting: Licensed Clinical Social Worker

## 2021-11-21 DIAGNOSIS — F4310 Post-traumatic stress disorder, unspecified: Secondary | ICD-10-CM

## 2021-11-21 DIAGNOSIS — F3181 Bipolar II disorder: Secondary | ICD-10-CM

## 2021-11-21 NOTE — Progress Notes (Signed)
   THERAPIST PROGRESS NOTE  Virtual Visit via Video Note  I connected with Louis [name] on 11/21/21 at  4:00 PM EDT by a video enabled telemedicine application and verified that I am speaking with the correct person using two identifiers.  Location: Patient: [facility] Provider: [facility]    I discussed the limitations of evaluation and management by telemedicine and the availability of in person appointments. The patient expressed understanding and agreed to proceed.     I discussed the assessment and treatment plan with the patient. The patient was provided an opportunity to ask questions and all were answered. The patient agreed with the plan and demonstrated an understanding of the instructions.   The patient was advised to call back or seek an in-person evaluation if the symptoms worsen or if the condition fails to improve as anticipated.  I provided 30 minutes of non-face-to-face time during this encounter.   Dory Horn, LCSW  Participation Level: Active  Behavioral Response: CasualAlertAnxious and Depressed  Type of Therapy: Individual Therapy  Treatment Goals addressed: Decreased PHQ-9 below 10 and decreased GAD-7 below 5    ProgressTowards Goals: Progressing  Interventions: CBT and Motivational Interviewing  Summary: Louis [name] is a 51 y.o. male who presents with depressed and anxious mood\affect.  Patient was pleasant, cooperative, maintained good eye contact.  He engaged well in therapy session was dressed casually.  Reports primary stressors as insomnia, depression, anxiety.  Patient reports symptoms for depression as worthlessness, hopelessness, irritability, and insomnia.  Patient reports restlessness for anxiety and psychosis for paranoia.  Patient reports attempting to utilize grounding techniques that have not worked in the past such as attempting to touch each finger to his thumb counting to 10.  Suicidal/Homicidal: Nowithout  intent/plan  Therapist Response:     Intervention/Plan: LCSW utilized teaching new grounding technique for "5, 4, 3, 2, 1 grounding technique".  This is a grounding technique to help utilize counting from backwards from 5-1 utilizing senses for site, touch, listen, smell, and taste.  Patient attempt to use this technique moving forward at least 1 time weekly.  Patient reports walking 3-4 times weekly for at least 45 minutes.  LCSW administered the PHQ-9 and a GAD-7.  LCSW educated patient on PHQ-9 and GAD-7.  Patient decreased PHQ-9 and GAD-7 by one-point each  Plan: Return again in 4 weeks.  Diagnosis: PTSD (post-traumatic stress disorder)  Bipolar 2 disorder ([place])  Collaboration of Care: none today   Patient/Guardian was advised Release of Information must be obtained prior to any record release in order to collaborate their care with an outside provider. Patient/Guardian was advised if they have not already done so to contact the registration department to sign all necessary forms in order for [place] to release information regarding their care.   Consent: Patient/Guardian gives verbal consent for treatment and assignment of benefits for services provided during this visit. Patient/Guardian expressed understanding and agreed to proceed.   Dory Horn, LCSW 11/21/2021

## 2021-12-24 ENCOUNTER — Ambulatory Visit (INDEPENDENT_AMBULATORY_CARE_PROVIDER_SITE_OTHER): Payer: Medicaid Other | Admitting: Licensed Clinical Social Worker

## 2021-12-24 DIAGNOSIS — F332 Major depressive disorder, recurrent severe without psychotic features: Secondary | ICD-10-CM

## 2021-12-24 DIAGNOSIS — F411 Generalized anxiety disorder: Secondary | ICD-10-CM

## 2021-12-24 NOTE — Progress Notes (Signed)
   THERAPIST PROGRESS NOTE  Virtual Visit via Telephone Note  I connected with [name] on 12/24/21 at  4:00 PM EDT by telephone and verified that I am speaking with the correct person using two identifiers.  Location: Patient: [facility]   Provider: [facility]    I discussed the limitations, risks, security and privacy concerns of performing an evaluation and management service by telephone and the availability of in person appointments. I also discussed with the patient that there may be a patient responsible charge related to this service. The patient expressed understanding and agreed to proceed.     I discussed the assessment and treatment plan with the patient. The patient was provided an opportunity to ask questions and all were answered. The patient agreed with the plan and demonstrated an understanding of the instructions.   The patient was advised to call back or seek an in-person evaluation if the symptoms worsen or if the condition fails to improve as anticipated.  I provided 40 minutes of non-face-to-face time during this encounter.   [name], LCSW   Participation Level: Active  Behavioral Response: CasualAlertAnxious and Depressed  Type of Therapy: Individual Therapy  Treatment Goals addressed: Walking 3 times weekly  ProgressTowards Goals: Progressing  Interventions: CBT and Motivational Interviewing  Summary: JAYLEND REILAND is a 51 y.o. male who presents with depressed mood.  Patient was pleasant, cooperative, maintained good eye contact.  He engaged well in therapy session and was not observed due to session being completed via phone.  Patient started out session by stating "everything is going overall well".  LCSW then administered the GAD-7 and PHQ-9 with both increase and numbers and overall difficulty level.  LCSW confronted patient about increased but stating things have been going overall well.  LCSW utilized the example of  thoughts of hurting self or wishing he was dead for the PHQ-9 question to every day from several days last session.  LCSW used motivational interviewing for reflective listening, open-ended questions, and positive affirmations in today's session.  LCSW spoke with patient about self analyzing "attitude and effort".  Patient states that it has been difficult to talk about certain things.  LCSW was open to discussion of barriers of therapy.  LCSW encouraged and praised.  Patient for constructive feedback.  Patient reports utilizing coping skills for walking and grounding technique for "5, 4, 3, 2, 1".  Plan for patient is to follow-up in 4 weeks.  Suicidal/Homicidal: Nowithout intent/plan    Plan: Return again in 4 weeks.  Diagnosis: Severe episode of recurrent major depressive disorder, without psychotic features (HCC)  Generalized anxiety disorder  Collaboration of Care: Other None today  Patient/Guardian was advised Release of Information must be obtained prior to any record release in order to collaborate their care with an outside provider. Patient/Guardian was advised if they have not already done so to contact the registration department to sign all necessary forms in order for [place] to release information regarding their care.   Consent: Patient/Guardian gives verbal consent for treatment and assignment of benefits for services provided during this visit. Patient/Guardian expressed understanding and agreed to proceed.   [name], LCSW 12/24/2021

## 2022-01-17 ENCOUNTER — Other Ambulatory Visit (HOSPITAL_COMMUNITY): Payer: Self-pay | Admitting: Psychiatry

## 2022-01-17 DIAGNOSIS — F332 Major depressive disorder, recurrent severe without psychotic features: Secondary | ICD-10-CM

## 2022-01-17 DIAGNOSIS — F3181 Bipolar II disorder: Secondary | ICD-10-CM

## 2022-01-23 ENCOUNTER — Ambulatory Visit (INDEPENDENT_AMBULATORY_CARE_PROVIDER_SITE_OTHER): Payer: Medicaid Other | Admitting: Licensed Clinical Social Worker

## 2022-01-23 DIAGNOSIS — F4310 Post-traumatic stress disorder, unspecified: Secondary | ICD-10-CM

## 2022-01-23 DIAGNOSIS — F3181 Bipolar II disorder: Secondary | ICD-10-CM
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
## 2022-01-24 NOTE — Telephone Encounter (Signed)
Received request for refill of patients Abilify.  This was sent in.   Sent: -Abilify 10 mg daily. 90 tablets with 1 refill.     Arna Snipe MD Resident

## 2022-01-27 ENCOUNTER — Telehealth (HOSPITAL_COMMUNITY): Payer: Self-pay | Admitting: *Deleted

## 2022-01-27 DIAGNOSIS — F5105 Insomnia due to other mental disorder: Secondary | ICD-10-CM

## 2022-01-27 NOTE — Telephone Encounter (Signed)
Rx Refill Request -- zolpidem (AMBIEN) 5 MG tablet TAKE 1 TABLET(5 MG) BY MOUTH AT BEDTIME AS NEEDED FOR SLEEP

## 2022-01-28 MED ORDER — ZOLPIDEM TARTRATE 5 MG PO TABS: ORAL_TABLET | ORAL | 0 refills | Status: DC

## 2022-01-28 NOTE — Telephone Encounter (Signed)
Received request for refill of patients Ambien.  This was sent in.    Sent: -Ambien 5 mg PRN QHS.  15 tablets with 0 refills sent.     Arna Snipe MD Resident

## 2022-01-28 NOTE — Addendum Note (Signed)
Addended by: Lauro Franklin on: 01/28/2022 07:19 AM   Modules accepted: Orders

## 2022-02-12 ENCOUNTER — Telehealth (INDEPENDENT_AMBULATORY_CARE_PROVIDER_SITE_OTHER): Payer: Medicaid Other | Admitting: Psychiatry

## 2022-02-12 ENCOUNTER — Encounter (HOSPITAL_COMMUNITY): Payer: Self-pay | Admitting: Psychiatry

## 2022-02-12 DIAGNOSIS — F3181 Bipolar II disorder: Secondary | ICD-10-CM

## 2022-02-12 DIAGNOSIS — F411 Generalized anxiety disorder: Secondary | ICD-10-CM | POA: Diagnosis not present

## 2022-02-12 DIAGNOSIS — F5105 Insomnia due to other mental disorder: Secondary | ICD-10-CM

## 2022-02-12 DIAGNOSIS — F99 Mental disorder, not otherwise specified: Secondary | ICD-10-CM | POA: Diagnosis not present

## 2022-02-12 DIAGNOSIS — F32A Depression, unspecified: Secondary | ICD-10-CM | POA: Insufficient documentation

## 2022-02-12 MED ORDER — LAMOTRIGINE 200 MG PO TABS: ORAL_TABLET | ORAL | 1 refills | Status: DC

## 2022-02-12 MED ORDER — MIRTAZAPINE 45 MG PO TABS: 45.0000 mg | ORAL_TABLET | Freq: Every day | ORAL | 1 refills | Status: DC

## 2022-02-12 MED ORDER — HYDROXYZINE HCL 50 MG PO TABS: 50.0000 mg | ORAL_TABLET | Freq: Three times a day (TID) | ORAL | 1 refills | Status: DC | PRN

## 2022-02-12 MED ORDER — ZOLPIDEM TARTRATE 5 MG PO TABS: ORAL_TABLET | ORAL | 3 refills | Status: DC

## 2022-02-12 MED ORDER — ARIPIPRAZOLE 10 MG PO TABS: ORAL_TABLET | ORAL | 1 refills | Status: DC

## 2022-02-12 NOTE — Progress Notes (Signed)
BH MD/PA/NP OP Progress Note Virtual Visit via Telephone Note  I connected with Louis [name] on 02/12/22 at  3:00 PM EDT by telephone and verified that I am speaking with the correct person using two identifiers.  Location: Patient: home Provider: Clinic   I discussed the limitations, risks, security and privacy concerns of performing an evaluation and management service by telephone and the availability of in person appointments. I also discussed with the patient that there may be a patient responsible charge related to this service. The patient expressed understanding and agreed to proceed.   I provided 30 minutes of non-face-to-face time during this encounter.   02/12/2022 11:17 AM Louis [name]  MRN:  [id number]   Chief Complaint: "I have not been sleeping"  HPI: 51 year old male seen today for follow up psychiatric evaluation.  He has a psychiatric history of PTSD, anxiety, bipolar 2 disorder, and depression. He is currently managed on mirtazapine 45 mg nightly, Lamictal 200 mg nightly, Abilify 5 mg daily, Ambien 5 mg nightly as needed, gabapentin 600 mg 3 times daily (received from his PCP) and hydroxyzine 50 mg three times daily as needed. He notes that his medications are somewhat effective in managing his psychiatric conditions.   Today he was unable to log in virtually so his exam was done over the phone.  During exam he was pleasant, cooperative, and engaged in conversation.  He informed Clinical research associate that he has been sleeping well.  He informed Clinical research associate that he has been out of his Ambien for the last 3 weeks.  Patient informed writer that due to lack of sleep he has been more irritable.  He notes that during the day he is restless at work.  Patient informed writer that his anxiety and depression are well managed.  Provider conducted a GAD-7 and patient scored a 14.  He informed Clinical research associate that he is worried about losing his Medicaid.  Provider also conducted PHQ-9 and patient scored a 12.  He  endorses adequate appetite.  Today he denies SI/HI/VAH, mania, paranoia.   Patient informed Clinical research associate that he continues to have back pain.  He reports the gabapentin and Percocet are somewhat effective in managing his pain.  Today Ambien 5 mg restarted to help manage sleep.  Patient will continue other medications as prescribed.  He will follow up outpatient counseling for therapy.  No other concerns at this time.    Visit Diagnosis:    ICD-10-CM   1. Bipolar 2 disorder (HCC)  F31.81 ARIPiprazole (ABILIFY) 10 MG tablet    lamoTRIgine (LAMICTAL) 200 MG tablet    2. Severe episode of recurrent major depressive disorder, without psychotic features (HCC)  F33.2 ARIPiprazole (ABILIFY) 10 MG tablet    mirtazapine (REMERON) 45 MG tablet    3. Generalized anxiety disorder  F41.1 hydrOXYzine (ATARAX) 50 MG tablet    mirtazapine (REMERON) 45 MG tablet    4. Insomnia  G47.00 mirtazapine (REMERON) 45 MG tablet    5. Insomnia due to other mental disorder  F51.05 zolpidem (AMBIEN) 5 MG tablet   F99       Past Psychiatric History: PTSD, anxiety, bipolar 2 disorder, and depression  Past Medical History:  Past Medical History:  Diagnosis Date   Anxiety    Back pain    Bipolar disorder (HCC)    Chronic lower back pain 03/09/2014   For over 20 years- since serving in the [employer].  Lower and cervical pain.    Closed bicondylar fracture of proximal end  of right tibia    Depression    Diarrhea    Headache    Hypercholesterolemia    Hypertension    Insomnia    Memory loss    due to attempted suicide by hanging   Migraine    Pain of right  arm 03/09/2014   Pneumonia    as a baby   Post traumatic stress disorder    Spondylosis     Past Surgical History:  Procedure Laterality Date   MULTIPLE TOOTH EXTRACTIONS     ORIF TIBIA FRACTURE Left 04/2014   dr Turner Daniels   ORIF TIBIA PLATEAU Right 05/10/2014   Procedure: OPEN REDUCTION INTERNAL FIXATION (ORIF)RIGHT PROXIMAL TIBIA FRACTURE;  Surgeon:  Nestor Lewandowsky, MD;  Location: [facility] OR;  Service: Orthopedics;  Laterality: Right;    Family Psychiatric History: Mother anxiety  Family History:  Family History  Problem Relation Age of Onset   Stroke Father    Colon cancer Father    Esophageal cancer Neg Hx    Stomach cancer Neg Hx    Rectal cancer Neg Hx     Social History:  Social History   Socioeconomic History   Marital status: Single    Spouse name: Not on file   Number of children: 1   Years of education: HS +   Highest education level: Not on file  Occupational History   Not on file  Tobacco Use   Smoking status: Never   Smokeless tobacco: Never  Vaping Use   Vaping Use: Never used  Substance and Sexual Activity   Alcohol use: No   Drug use: No   Sexual activity: Not on file  Other Topics Concern   Not on file  Social History Narrative   Patient is single and lives alone.   Patient has one child.   Patient has high school education and some college.   Patient is currently unemployed.   Patient is left-handed.   Patient drinks a 2 liter of soda on most days.         Social Determinants of Health   Financial Resource Strain: Low Risk  (07/26/2020)   Overall Financial Resource Strain (CARDIA)    Difficulty of Paying Living Expenses: Not hard at all  Food Insecurity: No Food Insecurity (07/26/2020)   Hunger Vital Sign    Worried About Running Out of Food in the Last Year: Never true    Ran Out of Food in the Last Year: Never true  Transportation Needs: No Transportation Needs (07/26/2020)   PRAPARE - Administrator, Civil Service (Medical): No    Lack of Transportation (Non-Medical): No  Physical Activity: Inactive (07/26/2020)   Exercise Vital Sign    Days of Exercise per Week: 0 days    Minutes of Exercise per Session: 0 min  Stress: Stress Concern Present (07/26/2020)   [employer] of Occupational Health - Occupational Stress Questionnaire    Feeling of Stress : Very much  Social  Connections: Socially Isolated (07/26/2020)   Social Connection and Isolation Panel [NHANES]    Frequency of Communication with Friends and Family: Three times a week    Frequency of Social Gatherings with Friends and Family: More than three times a week    Attends Religious Services: Never    Database administrator or Organizations: No    Attends Banker Meetings: Never    Marital Status: Divorced    Allergies:  Allergies  Allergen Reactions   Fish  Allergy Other (See Comments)   Anthrax Vaccine     Turned green, memory loss   Iodine Other (See Comments)   Other Diarrhea, Itching, Palpitations, Rash and Other (See Comments)    Green peppers, onions and seafood    Metabolic Disorder Labs: No results found for: "HGBA1C", "MPG" No results found for: "PROLACTIN" No results found for: "CHOL", "TRIG", "HDL", "CHOLHDL", "VLDL", "LDLCALC" No results found for: "TSH"  Therapeutic Level Labs: No results found for: "LITHIUM" No results found for: "VALPROATE" No results found for: "CBMZ"  Current Medications: Current Outpatient Medications  Medication Sig Dispense Refill   ARIPiprazole (ABILIFY) 10 MG tablet TAKE 1 TABLET BY MOUTH EVERY DAY 90 tablet 1   gabapentin (NEURONTIN) 600 MG tablet Take 1 tablet (600 mg total) by mouth 3 (three) times daily. 90 tablet 4   hydrOXYzine (ATARAX) 50 MG tablet Take 1 tablet (50 mg total) by mouth 3 (three) times daily as needed. 270 tablet 1   ibuprofen (ADVIL,MOTRIN) 800 MG tablet Take 800 mg by mouth every 8 (eight) hours. As needed for back pain     lamoTRIgine (LAMICTAL) 200 MG tablet TAKE 1 TABLET(200 MG) BY MOUTH DAILY 90 tablet 1   mirtazapine (REMERON) 45 MG tablet Take 1 tablet (45 mg total) by mouth at bedtime. 90 tablet 1   oxyCODONE-acetaminophen (PERCOCET) 7.5-325 MG tablet Take 1 tablet by mouth every 6 (six) hours.     pravastatin (PRAVACHOL) 20 MG tablet Take 20 mg by mouth at bedtime.     zolpidem (AMBIEN) 5 MG tablet  TAKE 1 TABLET(5 MG) BY MOUTH AT BEDTIME AS NEEDED FOR SLEEP 15 tablet 3   No current facility-administered medications for this visit.     Musculoskeletal: Strength & Muscle Tone:  Unable to assess due to telephone visit Gait & Station:  Unable to assess due to telephone visit Patient leans: N/A  Psychiatric Specialty Exam: Review of Systems  There were no vitals taken for this visit.There is no height or weight on file to calculate BMI.  General Appearance:  Unable to assess due to telephone visit  Eye Contact:  Good  Speech:  Clear and Coherent and Normal Rate  Volume:  Normal  Mood:  Euthymic and notes that he is able to cope with anxiety and depression  Affect:  Appropriate and Congruent  Thought Process:  Coherent, Goal Directed, and Linear  Orientation:  Full (Time, Place, and Person)  Thought Content: WDL and Logical   Suicidal Thoughts:  No  Homicidal Thoughts:  No  Memory:  Immediate;   Good Recent;   Good Remote;   Good  Judgement:  Good  Insight:  Good  Psychomotor Activity:   Unable to assess due to telephone visit  Concentration:  Concentration: Good and Attention Span: Good  Recall:  Good  Fund of Knowledge: Good  Language: Good  Akathisia:  No  Handed:  Left  AIMS (if indicated):Not done  Assets:  Communication Skills Desire for Improvement Financial Resources/Insurance Housing Social Support  ADL's:  Intact  Cognition: WNL  Sleep:  Poor   Screenings: GAD-7    Flowsheet Row Video Visit from 02/12/2022 in [facility] Counselor from 12/24/2021 in [facility] Counselor from 11/21/2021 in [facility] Counselor from 10/24/2021 in [facility] Counselor from 09/26/2021 in [facility]  Total GAD-7 Score 14 12 11 12 12       [phone]  Flowsheet Row Video Visit from 02/12/2022 in [facility]  Counselor from 12/24/2021 in [facility] Counselor from 11/21/2021 in [facility] Counselor from 10/24/2021 in [facility] Counselor from 09/26/2021 in [facility]  PHQ-2 Total Score 2 4 6 5 3   PHQ-9 Total Score 12 16 14 15 11       Flowsheet Row Video Visit from 02/12/2022 in [facility] Counselor from 10/24/2021 in [facility] Counselor from 09/26/2021 in [facility]  C-SSRS RISK CATEGORY Error: Q7 should not be populated when Q6 is No Low Risk Low Risk        Assessment and Plan: Patient notes that he is doing well on his current medication regimen. Today Ambien 5 mg restarted to help manage sleep.  Patient will continue other medications as prescribed.  1. Bipolar 2 disorder (HCC)  Continue- ARIPiprazole (ABILIFY) 10 MG tablet; TAKE 1 TABLET BY MOUTH EVERY DAY  Dispense: 90 tablet; Refill: 1 Continue- lamoTRIgine (LAMICTAL) 200 MG tablet; TAKE 1 TABLET(200 MG) BY MOUTH DAILY  Dispense: 90 tablet; Refill: 1  2. Generalized anxiety disorder  Continue- hydrOXYzine (ATARAX) 50 MG tablet; Take 1 tablet (50 mg total) by mouth 3 (three) times daily as needed.  Dispense: 270 tablet; Refill: 1 Continue- mirtazapine (REMERON) 45 MG tablet; Take 1 tablet (45 mg total) by mouth at bedtime.  Dispense: 90 tablet; Refill: 1  3. Insomnia due to other mental disorder  Continue- zolpidem (AMBIEN) 5 MG tablet; TAKE 1 TABLET(5 MG) BY MOUTH AT BEDTIME AS NEEDED FOR SLEEP  Dispense: 15 tablet; Refill: 3    Follow-up in 3 months Follow-up with therapy  09/28/2021, NP 02/12/2022, 11:17 AM

## 2022-02-20 ENCOUNTER — Ambulatory Visit (INDEPENDENT_AMBULATORY_CARE_PROVIDER_SITE_OTHER): Payer: Medicaid Other | Admitting: Licensed Clinical Social Worker

## 2022-02-20 DIAGNOSIS — F3181 Bipolar II disorder: Secondary | ICD-10-CM

## 2022-02-20 DIAGNOSIS — F4310 Post-traumatic stress disorder, unspecified: Secondary | ICD-10-CM | POA: Diagnosis not present

## 2022-02-20 NOTE — Progress Notes (Signed)
   THERAPIST PROGRESS NOTE  Virtual Visit via Telephone Note  I connected with [name] on 02/20/22 at  4:00 PM EDT by telephone and verified that I am speaking with the correct person using two identifiers.  Location: Patient: [facility]  Provider: Providers Home    I discussed the limitations, risks, security and privacy concerns of performing an evaluation and management service by telephone and the availability of in person appointments. I also discussed with the patient that there may be a patient responsible charge related to this service. The patient expressed understanding and agreed to proceed.     I discussed the assessment and treatment plan with the patient. The patient was provided an opportunity to ask questions and all were answered. The patient agreed with the plan and demonstrated an understanding of the instructions.   The patient was advised to call back or seek an in-person evaluation if the symptoms worsen or if the condition fails to improve as anticipated.  I provided 30 minutes of non-face-to-face time during this encounter.   [name], LCSW   Participation Level: Active  Behavioral Response: CasualAlertAnxious and Depressed  Type of Therapy: Individual Therapy    Interventions: CBT and Motivational Interviewing    Suicidal/Homicidal: Nowithout intent/plan  Therapist Response:   Objective:  Pt was alert and oriented x 5. He was not observed as session was completed via phone. Pt was pleasant, cooperative, and engage well in therapy session. He presented with depressed and anxious mood.    Subjective:  Pt reports primary stressor as dating, insomnia, and poor self-confidence. Pt reports that he has been struggling with his sleep due to racing thoughts and when he does fall asleep, he is unable to stay asleep. Pt report amban works well but he does not want to take the all the time. LCSW and pt have talked before about "White noise"  and mediation but neither has been effective. Pt reports that for the first time since 2006 he has started talking to a woman that he wants to take out on a date. Pt reports poor self-image and self-confidence.   Intervention/Plan:    LCSW spoke with pt about positive affirmations daily. LCSW used supportive therapy for praise and encouragement. LCSW used motivation interviewing for open ended questions, positive affirmations, and reflective listening. Pt educated on taking medication as prescribed. LCSW educated pt on signs and symptoms of depression. LCSW used unconditional positive regard.        Plan: Return again in 3 weeks.  Diagnosis: PTSD (post-traumatic stress disorder)  Bipolar 2 disorder (HCC)  Collaboration of Care: Other None today   Patient/Guardian was advised Release of Information must be obtained prior to any record release in order to collaborate their care with an outside provider. Patient/Guardian was advised if they have not already done so to contact the registration department to sign all necessary forms in order for [place] to release information regarding their care.   Consent: Patient/Guardian gives verbal consent for treatment and assignment of benefits for services provided during this visit. Patient/Guardian expressed understanding and agreed to proceed.   [name], LCSW 02/20/2022

## 2022-03-13 ENCOUNTER — Ambulatory Visit (INDEPENDENT_AMBULATORY_CARE_PROVIDER_SITE_OTHER): Payer: Medicaid Other | Admitting: Licensed Clinical Social Worker

## 2022-03-13 DIAGNOSIS — F4310 Post-traumatic stress disorder, unspecified: Secondary | ICD-10-CM | POA: Diagnosis not present

## 2022-03-13 DIAGNOSIS — F332 Major depressive disorder, recurrent severe without psychotic features: Secondary | ICD-10-CM

## 2022-03-13 NOTE — Progress Notes (Signed)
   THERAPIST PROGRESS NOTE  Virtual Visit via Video Note  I connected with Louis [name] on 03/13/22 at  4:00 PM EDT by a video enabled telemedicine application and verified that I am speaking with the correct person using two identifiers.  Location: Patient: [facility]  Provider: Provider Home    I discussed the limitations of evaluation and management by telemedicine and the availability of in person appointments. The patient expressed understanding and agreed to proceed.      I discussed the assessment and treatment plan with the patient. The patient was provided an opportunity to ask questions and all were answered. The patient agreed with the plan and demonstrated an understanding of the instructions.   The patient was advised to call back or seek an in-person evaluation if the symptoms worsen or if the condition fails to improve as anticipated.  I provided 30 minutes of non-face-to-face time during this encounter.   [name], LCSW   Participation Level: Active  Behavioral Response: CasualAlertAnxious and Depressed  Type of Therapy: Individual Therapy  Treatment Goals addressed:   ProgressTowards Goals: Progressing  Interventions: CBT and Motivational Interviewing   Suicidal/Homicidal: Yeswithout intent/plan  Therapist Response:     Plan: Return again in 3 weeks.  Diagnosis: PTSD (post-traumatic stress disorder)  Severe episode of recurrent major depressive disorder, without psychotic features (HCC)  Collaboration of Care: Other None reported today   Patient/Guardian was advised Release of Information must be obtained prior to any record release in order to collaborate their care with an outside provider. Patient/Guardian was advised if they have not already done so to contact the registration department to sign all necessary forms in order for [place] to release information regarding their care.   Consent: Patient/Guardian gives verbal consent for  treatment and assignment of benefits for services provided during this visit. Patient/Guardian expressed understanding and agreed to proceed.   [name], LCSW 03/13/2022

## 2022-03-30 ENCOUNTER — Other Ambulatory Visit (HOSPITAL_COMMUNITY): Payer: Self-pay | Admitting: Psychiatry

## 2022-03-30 DIAGNOSIS — F3181 Bipolar II disorder: Secondary | ICD-10-CM

## 2022-04-08 ENCOUNTER — Other Ambulatory Visit: Payer: Self-pay

## 2022-04-08 ENCOUNTER — Ambulatory Visit (INDEPENDENT_AMBULATORY_CARE_PROVIDER_SITE_OTHER): Payer: No Typology Code available for payment source | Admitting: Licensed Clinical Social Worker

## 2022-04-08 DIAGNOSIS — F332 Major depressive disorder, recurrent severe without psychotic features: Secondary | ICD-10-CM

## 2022-04-08 DIAGNOSIS — F4310 Post-traumatic stress disorder, unspecified: Secondary | ICD-10-CM

## 2022-04-08 NOTE — Progress Notes (Signed)
   THERAPIST PROGRESS NOTE  Virtual Visit via Telephone Note  I connected with Louis [name] on 04/08/22 at  4:00 PM EDT by telephone and verified that I am speaking with the correct person using two identifiers.  Location: Patient: [facility]  Provider: Providers Home    I discussed the limitations, risks, security and privacy concerns of performing an evaluation and management service by telephone and the availability of in person appointments. I also discussed with the patient that there may be a patient responsible charge related to this service. The patient expressed understanding and agreed to proceed.       I discussed the assessment and treatment plan with the patient. The patient was provided an opportunity to ask questions and all were answered. The patient agreed with the plan and demonstrated an understanding of the instructions.   The patient was advised to call back or seek an in-person evaluation if the symptoms worsen or if the condition fails to improve as anticipated.  I provided 30 minutes of non-face-to-face time during this encounter.   Louis Horn, LCSW   Participation Level: Active  Behavioral Response: CasualAlertAnxious and Depressed  Type of Therapy: Individual Therapy  Treatment Goals addressed: ideantify 3 triggers for anxiety   ProgressTowards Goals: Progressing  Interventions: CBT and Motivational Interviewing   Suicidal/Homicidal: Nowithout intent/plan  Therapist Response:    Pt was alert an oriented x 5. He was not observed as session was completed by phone. Louis [name] presents with depressed and anxious mood/affect. He was pleasant, cooperative and engaged well in therapy session.   Pt reports symptoms for reoccurring nightmares, flashbacks, guilt/shame, and insomnia. Pt reports his only relief for sleep is his Ambien. 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 Pt reports that coping skills have not been working for imaginary rehearsal. Pt reports that he still spends most nights staring at the ceiling or watching tv. Pt also reports he has not been attending group therapy.   Intervention: LCSW spoke with pt about imaginary rehearsal. LCSW spoke with pt about how often he practices techniques. LCSW spoke with pt today about controlling two things in his day-to-day life "Attitude and effort". LCSW used motivational interviewing for reflective listening, open ended questions, and positive affirmations.   Plan: Return again in 3 weeks.  Diagnosis: Severe episode of recurrent major depressive disorder, without psychotic features ([place])  PTSD (post-traumatic stress disorder)  Collaboration of Care: Other None today   Patient/Guardian was advised Release of Information must be obtained prior to any record release in order to collaborate their care with an outside provider. Patient/Guardian was advised if they have not already done so to contact the registration department to sign all necessary forms in order for [place] to release information regarding their care.   Consent: Patient/Guardian gives verbal consent for treatment and assignment of benefits for services provided during this visit. Patient/Guardian expressed understanding and agreed to proceed.   Louis Horn, LCSW 04/08/2022

## 2022-04-08 NOTE — Plan of Care (Signed)
  Problem: Anxiety Disorder CCP Problem  1 GAD  Goal: Walk 2 x weekly  Outcome: Progressing Goal: LTG: Patient will score less than 5 on the Generalized Anxiety Disorder 7 Scale (GAD-7) Outcome: Progressing   Problem: Depression CCP Problem  1 MDD  Goal: Decrease SI to 1 x monthly with no plan or intent.  Outcome: Progressing Goal: LTG: Louis [name] WILL SCORE LESS THAN 10 ON THE PATIENT HEALTH QUESTIONNAIRE (PHQ-9) Outcome: Progressing Goal: STG: Louis [name] WILL COMPLETE AT LEAST 80% OF ASSIGNED HOMEWORK Outcome: Progressing   Problem: Anxiety Disorder CCP Problem  1 GAD  Goal: Attend 2 group therapy per month.  Outcome: Not Progressing

## 2022-04-10 ENCOUNTER — Other Ambulatory Visit (HOSPITAL_COMMUNITY): Payer: Self-pay | Admitting: Psychiatry

## 2022-04-10 ENCOUNTER — Other Ambulatory Visit: Payer: Self-pay

## 2022-04-10 ENCOUNTER — Telehealth (HOSPITAL_COMMUNITY): Payer: Self-pay | Admitting: *Deleted

## 2022-04-10 DIAGNOSIS — F99 Mental disorder, not otherwise specified: Secondary | ICD-10-CM

## 2022-04-10 DIAGNOSIS — F3181 Bipolar II disorder: Secondary | ICD-10-CM

## 2022-04-10 DIAGNOSIS — F411 Generalized anxiety disorder: Secondary | ICD-10-CM

## 2022-04-10 MED ORDER — LAMOTRIGINE 200 MG PO TABS
200.0000 mg | ORAL_TABLET | Freq: Every day | ORAL | 1 refills | Status: DC
  Filled 2022-04-10: qty 30, 30d supply, fill #0

## 2022-04-10 MED ORDER — ZOLPIDEM TARTRATE 5 MG PO TABS: ORAL_TABLET | ORAL | 3 refills | Status: DC

## 2022-04-10 MED ORDER — MIRTAZAPINE 45 MG PO TABS
45.0000 mg | ORAL_TABLET | Freq: Every day | ORAL | 1 refills | Status: DC
  Filled 2022-04-10: qty 30, 30d supply, fill #0

## 2022-04-10 MED ORDER — ZOLPIDEM TARTRATE 5 MG PO TABS
ORAL_TABLET | ORAL | 3 refills | Status: DC
  Filled 2022-04-10 (×2): qty 15, 15d supply, fill #0

## 2022-04-10 MED ORDER — HYDROXYZINE HCL 50 MG PO TABS
50.0000 mg | ORAL_TABLET | Freq: Three times a day (TID) | ORAL | 1 refills | Status: DC | PRN
  Filled 2022-04-10: qty 90, 30d supply, fill #0

## 2022-04-10 MED ORDER — ARIPIPRAZOLE 10 MG PO TABS
10.0000 mg | ORAL_TABLET | Freq: Every day | ORAL | 1 refills | Status: DC
  Filled 2022-04-10: qty 30, 30d supply, fill #0

## 2022-04-10 NOTE — Telephone Encounter (Signed)
Call from patient, asking to change his pharmacy to [facility] and needs rxs sent in now. Will notify Dr Ronne Binning to send in his meds. Also informed patient Dr Ronne Binning was changing positions here at the clinic and he would need to be rescheduled for his next appt in Nov.

## 2022-04-10 NOTE — Telephone Encounter (Signed)
Ambien was sent to [facility] in [place] on [address] however his other medications were sent to [facility].  Thank you for informing the patient of providers position change.

## 2022-04-16 NOTE — Telephone Encounter (Signed)
Opened a second time in error. 

## 2022-04-28 ENCOUNTER — Ambulatory Visit (HOSPITAL_COMMUNITY): Payer: Self-pay | Admitting: Licensed Clinical Social Worker

## 2022-05-05 ENCOUNTER — Telehealth (HOSPITAL_COMMUNITY): Payer: Self-pay | Admitting: Psychiatry

## 2022-05-06 ENCOUNTER — Telehealth (INDEPENDENT_AMBULATORY_CARE_PROVIDER_SITE_OTHER)
Payer: No Typology Code available for payment source | Admitting: Student in an Organized Health Care Education/Training Program

## 2022-05-06 DIAGNOSIS — F99 Mental disorder, not otherwise specified: Secondary | ICD-10-CM

## 2022-05-06 DIAGNOSIS — F3181 Bipolar II disorder: Secondary | ICD-10-CM | POA: Diagnosis not present

## 2022-05-06 DIAGNOSIS — F411 Generalized anxiety disorder: Secondary | ICD-10-CM

## 2022-05-06 DIAGNOSIS — F5105 Insomnia due to other mental disorder: Secondary | ICD-10-CM

## 2022-05-06 MED ORDER — ZOLPIDEM TARTRATE 5 MG PO TABS: ORAL_TABLET | ORAL | 0 refills | Status: DC

## 2022-05-06 MED ORDER — MIRTAZAPINE 45 MG PO TABS: 45.0000 mg | ORAL_TABLET | Freq: Every day | ORAL | 1 refills | Status: AC

## 2022-05-06 MED ORDER — ARIPIPRAZOLE 10 MG PO TABS: 10.0000 mg | ORAL_TABLET | Freq: Every day | ORAL | 1 refills | Status: AC

## 2022-05-06 MED ORDER — HYDROXYZINE HCL 50 MG PO TABS: 50.0000 mg | ORAL_TABLET | Freq: Three times a day (TID) | ORAL | 1 refills | Status: AC | PRN

## 2022-05-06 MED ORDER — ZOLPIDEM TARTRATE 5 MG PO TABS: 5.0000 mg | ORAL_TABLET | Freq: Every evening | ORAL | 0 refills | Status: AC | PRN

## 2022-05-06 MED ORDER — LAMOTRIGINE 200 MG PO TABS: 200.0000 mg | ORAL_TABLET | Freq: Every day | ORAL | 1 refills | Status: AC

## 2022-05-06 NOTE — Progress Notes (Signed)
[place] MD/PA/NP OP Progress Note  05/06/2022 5:45 PM Louis [name]  MRN:  [id number]  Chief Complaint:  Chief Complaint  Patient presents with   Follow-up   Virtual Visit via Telephone Note  I connected with Louis [name] on 05/06/22 at  4:30 PM EST by telephone and verified that I am speaking with the correct person using two identifiers.  Location: Patient: [facility] Provider: Office   I discussed the limitations, risks, security and privacy concerns of performing an evaluation and management service by telephone and the availability of in person appointments. I also discussed with the patient that there may be a patient responsible charge related to this service. The patient expressed understanding and agreed to proceed.   History of Present Illness: Louis [name] is a 51-year-old male seen today for follow up psychiatric evaluation.  He has a psychiatric history of PTSD, anxiety, bipolar 2 disorder, and depression. He is currently managed on mirtazapine 45 mg nightly, Lamictal 200 mg nightly, Abilify 10 mg daily, Ambien 5 mg nightly as needed, gabapentin 600 mg 3 times daily (received from his PCP) and hydroxyzine 50 mg three times daily as needed.  Patient reports compliance to medications.  He notes that his medications are effective in managing his psychiatric conditions.   Patient reports overall he feels he is doing "really well."  Patient reports she believes combination of medications working and also feels that work has been going very well for him.I discussed the assessment and treatment plan with the patient. The patient was provided an opportunity to ask questions and all were answered. The patient agreed with the plan and demonstrated an understanding of the instructions.  Patient reports his appetite has been "good" and endorses that his sleep is okay.  Patient reports he is requiring Ambien approximately 3 times per week endorsing that he may have occasional nightmares related to  his PTSD waking him up 3-4 times a week.  Patient reports that on occasion he may also have flashbacks however he feels overall his PTSD symptoms are bothering him less than normal and denies having significant anxiety or panic attacks as of late.  Patient denies SI, HI and AVH.  Patient reports that he has been on trazodone in the past and did not like the medication.  Discussion was held with patient about considering prazosin for PTSD related symptoms that are interrupting his sleep in order to decrease reliance on Ambien.  Patient reports at this time he would like to continue his current regimen as he is feeling very good but will consider the medication in the future.   The patient was advised to call back or seek an in-person evaluation if the symptoms worsen or if the condition fails to improve as anticipated.  I provided 20 minutes of non-face-to-face time during this encounter.  PGY-3 Freida Busman, MD  Visit Diagnosis:    ICD-10-CM   1. Bipolar 2 disorder (HCC)  F31.81 ARIPiprazole (ABILIFY) 10 MG tablet    lamoTRIgine (LAMICTAL) 200 MG tablet    2. Generalized anxiety disorder  F41.1 hydrOXYzine (ATARAX) 50 MG tablet    mirtazapine (REMERON) 45 MG tablet    3. Insomnia due to other mental disorder  F51.05 zolpidem (AMBIEN) 5 MG tablet   F99 zolpidem (AMBIEN) 5 MG tablet      Past Psychiatric History: PTSD, anxiety, bipolar 2 disorder, and depression  Trazodone (disliked)  Past Medical History:  Past Medical History:  Diagnosis Date   Anxiety  Back pain    Bipolar disorder ([place])    Chronic lower back pain 03/09/2014   For over 20 years- since serving in the [employer].  Lower and cervical pain.    Closed bicondylar fracture of proximal end of right tibia    Depression    Diarrhea    Headache    Hypercholesterolemia    Hypertension    Insomnia    Memory loss    due to attempted suicide by hanging   Migraine    Pain of right  arm 03/09/2014   Pneumonia    as a  baby   Post traumatic stress disorder    Spondylosis     Past Surgical History:  Procedure Laterality Date   MULTIPLE TOOTH EXTRACTIONS     ORIF TIBIA FRACTURE Left 04/2014   dr Mayer Camel   ORIF TIBIA PLATEAU Right 05/10/2014   Procedure: OPEN REDUCTION INTERNAL FIXATION (ORIF)RIGHT PROXIMAL TIBIA FRACTURE;  Surgeon: Kerin Salen, MD;  Location: [facility];  Service: Orthopedics;  Laterality: Right;    Family Psychiatric History:  Mother anxiety   Family History:  Family History  Problem Relation Age of Onset   Stroke Father    Colon cancer Father    Esophageal cancer Neg Hx    Stomach cancer Neg Hx    Rectal cancer Neg Hx     Social History:  Social History   Socioeconomic History   Marital status: Single    Spouse name: Not on file   Number of children: 1   Years of education: HS +   Highest education level: Not on file  Occupational History   Not on file  Tobacco Use   Smoking status: Never   Smokeless tobacco: Never  Vaping Use   Vaping Use: Never used  Substance and Sexual Activity   Alcohol use: No   Drug use: No   Sexual activity: Not on file  Other Topics Concern   Not on file  Social History Narrative   Patient is single and lives alone.   Patient has one child.   Patient has high school education and some college.   Patient is currently unemployed.   Patient is left-handed.   Patient drinks a 2 liter of soda on most days.         Social Determinants of Health   Financial Resource Strain: Low Risk  (07/26/2020)   Overall Financial Resource Strain (CARDIA)    Difficulty of Paying Living Expenses: Not hard at all  Food Insecurity: No Food Insecurity (07/26/2020)   Hunger Vital Sign    Worried About Running Out of Food in the Last Year: Never true    Ran Out of Food in the Last Year: Never true  Transportation Needs: No Transportation Needs (07/26/2020)   PRAPARE - Hydrologist (Medical): No    Lack of Transportation  (Non-Medical): No  Physical Activity: Inactive (07/26/2020)   Exercise Vital Sign    Days of Exercise per Week: 0 days    Minutes of Exercise per Session: 0 min  Stress: Stress Concern Present (07/26/2020)   [place]    Feeling of Stress : Very much  Social Connections: Socially Isolated (07/26/2020)   Social Connection and Isolation Panel [NHANES]    Frequency of Communication with Friends and Family: Three times a week    Frequency of Social Gatherings with Friends and Family: More than three times a week  Attends Religious Services: Never    Active Member of Clubs or Organizations: No    Attends Archivist Meetings: Never    Marital Status: Divorced    Allergies:  Allergies  Allergen Reactions   Fish Allergy Other (See Comments)   Anthrax Vaccine     Turned green, memory loss   Iodine Other (See Comments)   Other Diarrhea, Itching, Palpitations, Rash and Other (See Comments)    Green peppers, onions and seafood    Metabolic Disorder Labs: No results found for: "HGBA1C", "MPG" No results found for: "PROLACTIN" No results found for: "CHOL", "TRIG", "HDL", "CHOLHDL", "VLDL", "LDLCALC" No results found for: "TSH"  Therapeutic Level Labs: No results found for: "LITHIUM" No results found for: "VALPROATE" No results found for: "CBMZ"  Current Medications: Current Outpatient Medications  Medication Sig Dispense Refill   [START ON 06/06/2022] zolpidem (AMBIEN) 5 MG tablet Take 1 tablet (5 mg total) by mouth at bedtime as needed for sleep. 15 tablet 0   ARIPiprazole (ABILIFY) 10 MG tablet Take 1 tablet (10 mg total) by mouth daily. 90 tablet 1   gabapentin (NEURONTIN) 600 MG tablet Take 1 tablet (600 mg total) by mouth 3 (three) times daily. 90 tablet 4   hydrOXYzine (ATARAX) 50 MG tablet Take 1 tablet (50 mg total) by mouth 3 (three) times daily as needed. 270 tablet 1   ibuprofen (ADVIL,MOTRIN) 800  MG tablet Take 800 mg by mouth every 8 (eight) hours. As needed for back pain     lamoTRIgine (LAMICTAL) 200 MG tablet Take 1 tablet (200 mg total) by mouth daily. 90 tablet 1   mirtazapine (REMERON) 45 MG tablet Take 1 tablet (45 mg total) by mouth at bedtime. 90 tablet 1   oxyCODONE-acetaminophen (PERCOCET) 7.5-325 MG tablet Take 1 tablet by mouth every 6 (six) hours.     pravastatin (PRAVACHOL) 20 MG tablet Take 20 mg by mouth at bedtime.     zolpidem (AMBIEN) 5 MG tablet TAKE 1 TABLET(5 MG) BY MOUTH AT BEDTIME AS NEEDED FOR SLEEP 15 tablet 0   No current facility-administered medications for this visit.     Musculoskeletal: Defer  Psychiatric Specialty Exam: Review of Systems  Constitutional:  Negative for appetite change.  Psychiatric/Behavioral:  Positive for sleep disturbance. Negative for decreased concentration, hallucinations and suicidal ideas.     There were no vitals taken for this visit.There is no height or weight on file to calculate BMI.  General Appearance: NA  Eye Contact:  NA  Speech:  Clear and Coherent  Volume:  Normal  Mood:  Euthymic  Affect:  NA  Thought Process:  Coherent  Orientation:  Full (Time, Place, and Person)  Thought Content: Logical   Suicidal Thoughts:  No  Homicidal Thoughts:  No  Memory:  Immediate;   Good Recent;   Good  Judgement:  Fair  Insight:  Fair  Psychomotor Activity:  Normal  Concentration:  Concentration: Good  Recall:  NA  Fund of Knowledge: Good  Language: Good  Akathisia:  NA  Handed:    AIMS (if indicated): not done  Assets:  Communication Skills Desire for Improvement Housing Resilience Transportation Vocational/Educational  ADL's:  Intact  Cognition: WNL  Sleep:  Fair   Screenings: GAD-7    Flowsheet Row Video Visit from 02/12/2022 in [facility] Counselor from 12/24/2021 in [facility] Counselor from 11/21/2021 in [facility] Counselor from 10[facility]24/2021 in [facility] Counselor from 09/26/2021 in [facility]  Total GAD-7 Score 14 12 11 12 12       PHQ2-9    Flowsheet Row Video Visit from 02/12/2022 in [facility] Counselor from 12/24/2021 in [facility] Counselor from 11/21/2021 in [facility] Counselor from 10/24/2021 in [facility] Counselor from 09/26/2021 in [facility]  PHQ-2 Total Score 2 4 6 5 3   PHQ-9 Total Score 12 16 14 15 11       Flowsheet Row Video Visit from 02/12/2022 in [facility] Counselor from 10/24/2021 in [facility] Counselor from 09/26/2021 in [facility] Error: Q7 should not be populated when Q6 is No Low Risk Low Risk        Assessment and Plan: Kederick Debarge is a 89-year-old male seen today for follow up psychiatric evaluation.  He has a psychiatric history of PTSD, anxiety, bipolar 2 disorder, and depression.  Patient appears to be stable on his current medication regimen.  Patient is only using Ambien as needed, is not getting more than 15 pills/month.  We will reconsider titrating patient off Ambien at a future visit however at this time patient is at lower risk for significant reliance.  Bipolar 2 disorder - Continue Abilify 10 mg daily - Continue Lamictal 200 mg daily  GAD - Continue hydroxyzine 50 mg 3 times daily as needed Continue Remeron 45 mg nightly - continue gabapentin per PCP  Insomnia - Continue Ambien 5 mg as needed  Follow-up in approximately 2 months  Collaboration of Care: Collaboration of Care:   Patient/Guardian was advised Release of Information must be obtained prior to any record release in order to collaborate their care with an outside provider.  Patient/Guardian was advised if they have not already done so to contact the registration department to sign all necessary forms in order for [place] to release information regarding their care.   Consent: Patient/Guardian gives verbal consent for treatment and assignment of benefits for services provided during this visit. Patient/Guardian expressed understanding and agreed to proceed.    PGY-3 Freida Busman, MD 05/06/2022, 5:45 PM

## 2022-05-08 ENCOUNTER — Ambulatory Visit (INDEPENDENT_AMBULATORY_CARE_PROVIDER_SITE_OTHER): Payer: No Typology Code available for payment source | Admitting: Licensed Clinical Social Worker

## 2022-05-08 DIAGNOSIS — F4310 Post-traumatic stress disorder, unspecified: Secondary | ICD-10-CM

## 2022-05-08 DIAGNOSIS — F3181 Bipolar II disorder: Secondary | ICD-10-CM | POA: Diagnosis not present

## 2022-05-08 NOTE — Progress Notes (Signed)
   THERAPIST PROGRESS NOTE  Virtual Visit via Video Note  I connected with Louis [name] on 05/08/22 at  4:00 PM EST by a video enabled telemedicine application and verified that I am speaking with the correct person using two identifiers.  Location: Patient: [facility]  Provider: Provider Home    I discussed the limitations of evaluation and management by telemedicine and the availability of in person appointments. The patient expressed understanding and agreed to proceed.      I discussed the assessment and treatment plan with the patient. The patient was provided an opportunity to ask questions and all were answered. The patient agreed with the plan and demonstrated an understanding of the instructions.   The patient was advised to call back or seek an in-person evaluation if the symptoms worsen or if the condition fails to improve as anticipated.  I provided 30 minutes of non-face-to-face time during this encounter.   [name], LCSW   Participation Level: Active  Behavioral Response: CasualAlertAnxious and Depressed  Type of Therapy: Individual Therapy  Treatment Goals addressed:      ProgressTowards Goals: Progressing  Interventions: CBT, Motivational Interviewing, and Supportive  Suicidal/Homicidal: Nowithout intent/plan  Therapist Response:   Pt was alert and oriented x 5. He was not observed due to session being completed via phone. Pt was pleasant, cooperative, and engaged well in therapy session. He presented with anxious mood.   Pt reports primary stressor as hypervigilance, nightmares, and insomnia. Pt reports that he has been taking his medications as prescribed. He states that he has had a decrease in SI with no plan or intent. Pt reports no active suicidal thoughts at time of therapy. Pt reports using coping skills such as listening to music, walking, and community support groups.   Interventions/Plan: LCSW used supportive therapy for praise  and encouragement. LCSW used empowerment for person centered therapy. LCSW used psychoanalytic therapy for pt to express thoughts, feeling and emotions. LCSW educated pt on taking medications as prescribed.      Plan: Return again in 3 weeks.  Diagnosis: Bipolar 2 disorder (HCC)  PTSD (post-traumatic stress disorder)  Collaboration of Care: Other None today   Patient/Guardian was advised Release of Information must be obtained prior to any record release in order to collaborate their care with an outside provider. Patient/Guardian was advised if they have not already done so to contact the registration department to sign all necessary forms in order for [place] to release information regarding their care.   Consent: Patient/Guardian gives verbal consent for treatment and assignment of benefits for services provided during this visit. Patient/Guardian expressed understanding and agreed to proceed.   [name], LCSW 05/08/2022

## 2022-05-08 NOTE — Plan of Care (Signed)
Treatment plan updated for new dates for goals

## 2022-05-20 ENCOUNTER — Other Ambulatory Visit: Payer: Self-pay

## 2022-05-29 ENCOUNTER — Ambulatory Visit (HOSPITAL_COMMUNITY): Payer: No Payment, Other | Admitting: Licensed Clinical Social Worker

## 2022-05-29 ENCOUNTER — Encounter (HOSPITAL_COMMUNITY): Payer: Self-pay

## 2022-05-30 ENCOUNTER — Other Ambulatory Visit (HOSPITAL_COMMUNITY): Payer: Self-pay | Admitting: Student in an Organized Health Care Education/Training Program

## 2022-05-30 DIAGNOSIS — F3181 Bipolar II disorder: Secondary | ICD-10-CM

## 2022-05-30 DIAGNOSIS — F411 Generalized anxiety disorder: Secondary | ICD-10-CM

## 2022-05-30 NOTE — Progress Notes (Signed)
Patient notified his therapist that his medications were being rejected at pharmacy du to insurance changes. Spoke with pharm tech who noted errors and transferred provider to  someone else who was able to fix error where his medication at somehow been changed to 1 day supply and changed it back to 90 day supply for meds. Able to clarify that patient now has Medicare and this provider is not able to fill his Ambien with his new insurance.    PGY-3  Eliseo Gum, MD

## 2022-06-02 ENCOUNTER — Other Ambulatory Visit (HOSPITAL_COMMUNITY): Payer: Self-pay | Admitting: Psychiatry

## 2022-06-02 DIAGNOSIS — F5105 Insomnia due to other mental disorder: Secondary | ICD-10-CM

## 2022-06-02 MED ORDER — ZOLPIDEM TARTRATE 5 MG PO TABS: ORAL_TABLET | ORAL | 1 refills | Status: AC

## 2022-06-26 ENCOUNTER — Encounter (HOSPITAL_COMMUNITY): Payer: Self-pay

## 2022-06-26 ENCOUNTER — Telehealth (HOSPITAL_COMMUNITY): Payer: Self-pay | Admitting: Licensed Clinical Social Worker

## 2022-06-26 ENCOUNTER — Ambulatory Visit (HOSPITAL_COMMUNITY): Payer: No Typology Code available for payment source | Admitting: Licensed Clinical Social Worker

## 2022-06-26 NOTE — Telephone Encounter (Signed)
Pt called to inform him that [employer] health insurance is out of network with [facility]. Pt reported Understanding. LCSW advised pt to call customer service for in-network list. Pt was agreeable as he cannot afford out of network costs at this time. Pt to cancel appt today at 4pm.

## 2022-07-22 ENCOUNTER — Telehealth (HOSPITAL_COMMUNITY)
Payer: No Typology Code available for payment source | Admitting: Student in an Organized Health Care Education/Training Program
